# Patient Record
Sex: Female | Born: 1965 | Race: White | Hispanic: No | Marital: Married | Smoking: Never smoker
Health system: Southern US, Community
[De-identification: ages and names within clinical notes are randomized; demographics above are authoritative.]

## PROBLEM LIST (undated history)

## (undated) DIAGNOSIS — E119 Type 2 diabetes mellitus without complications: Secondary | ICD-10-CM

## (undated) DIAGNOSIS — O1490 Unspecified pre-eclampsia, unspecified trimester: Secondary | ICD-10-CM

## (undated) DIAGNOSIS — I10 Essential (primary) hypertension: Secondary | ICD-10-CM

## (undated) DIAGNOSIS — T7840XA Allergy, unspecified, initial encounter: Secondary | ICD-10-CM

## (undated) DIAGNOSIS — G4700 Insomnia, unspecified: Secondary | ICD-10-CM

## (undated) DIAGNOSIS — F32A Depression, unspecified: Secondary | ICD-10-CM

## (undated) DIAGNOSIS — R87629 Unspecified abnormal cytological findings in specimens from vagina: Secondary | ICD-10-CM

## (undated) DIAGNOSIS — E785 Hyperlipidemia, unspecified: Secondary | ICD-10-CM

## (undated) DIAGNOSIS — R7301 Impaired fasting glucose: Secondary | ICD-10-CM

## (undated) DIAGNOSIS — G43909 Migraine, unspecified, not intractable, without status migrainosus: Secondary | ICD-10-CM

## (undated) DIAGNOSIS — F329 Major depressive disorder, single episode, unspecified: Secondary | ICD-10-CM

## (undated) HISTORY — DX: Unspecified pre-eclampsia, unspecified trimester: O14.90

## (undated) HISTORY — DX: Allergy, unspecified, initial encounter: T78.40XA

## (undated) HISTORY — DX: Unspecified abnormal cytological findings in specimens from vagina: R87.629

## (undated) HISTORY — DX: Impaired fasting glucose: R73.01

## (undated) HISTORY — DX: Insomnia, unspecified: G47.00

## (undated) HISTORY — PX: OTHER SURGICAL HISTORY: SHX169

## (undated) HISTORY — DX: Essential (primary) hypertension: I10

## (undated) HISTORY — DX: Major depressive disorder, single episode, unspecified: F32.9

## (undated) HISTORY — DX: Hyperlipidemia, unspecified: E78.5

## (undated) HISTORY — DX: Depression, unspecified: F32.A

## (undated) HISTORY — DX: Migraine, unspecified, not intractable, without status migrainosus: G43.909

---

## 1995-07-20 DIAGNOSIS — I10 Essential (primary) hypertension: Secondary | ICD-10-CM

## 1995-07-20 HISTORY — DX: Essential (primary) hypertension: I10

## 1998-10-06 ENCOUNTER — Other Ambulatory Visit: Admission: RE | Admit: 1998-10-06 | Discharge: 1998-10-06 | Payer: Self-pay | Admitting: Gynecology

## 1999-10-13 ENCOUNTER — Other Ambulatory Visit: Admission: RE | Admit: 1999-10-13 | Discharge: 1999-10-13 | Payer: Self-pay | Admitting: Gynecology

## 2000-11-22 ENCOUNTER — Other Ambulatory Visit: Admission: RE | Admit: 2000-11-22 | Discharge: 2000-11-22 | Payer: Self-pay | Admitting: Gynecology

## 2002-01-12 ENCOUNTER — Other Ambulatory Visit: Admission: RE | Admit: 2002-01-12 | Discharge: 2002-01-12 | Payer: Self-pay | Admitting: Gynecology

## 2002-05-24 ENCOUNTER — Other Ambulatory Visit: Admission: RE | Admit: 2002-05-24 | Discharge: 2002-05-24 | Payer: Self-pay | Admitting: Gynecology

## 2002-07-04 ENCOUNTER — Encounter: Payer: Self-pay | Admitting: Internal Medicine

## 2002-07-04 ENCOUNTER — Encounter: Admission: RE | Admit: 2002-07-04 | Discharge: 2002-07-04 | Payer: Self-pay | Admitting: Internal Medicine

## 2002-07-13 ENCOUNTER — Encounter: Admission: RE | Admit: 2002-07-13 | Discharge: 2002-08-07 | Payer: Self-pay | Admitting: Internal Medicine

## 2002-09-21 ENCOUNTER — Other Ambulatory Visit: Admission: RE | Admit: 2002-09-21 | Discharge: 2002-09-21 | Payer: Self-pay | Admitting: Gynecology

## 2003-01-18 ENCOUNTER — Other Ambulatory Visit: Admission: RE | Admit: 2003-01-18 | Discharge: 2003-01-18 | Payer: Self-pay | Admitting: Gynecology

## 2003-06-25 ENCOUNTER — Other Ambulatory Visit: Admission: RE | Admit: 2003-06-25 | Discharge: 2003-06-25 | Payer: Self-pay | Admitting: Gynecology

## 2003-06-25 ENCOUNTER — Encounter: Admission: RE | Admit: 2003-06-25 | Discharge: 2003-06-25 | Payer: Self-pay | Admitting: Gynecology

## 2004-04-20 ENCOUNTER — Other Ambulatory Visit: Admission: RE | Admit: 2004-04-20 | Discharge: 2004-04-20 | Payer: Self-pay | Admitting: Gynecology

## 2007-04-12 ENCOUNTER — Ambulatory Visit: Payer: Self-pay | Admitting: Family Medicine

## 2007-04-12 DIAGNOSIS — G43009 Migraine without aura, not intractable, without status migrainosus: Secondary | ICD-10-CM | POA: Insufficient documentation

## 2007-04-12 DIAGNOSIS — G4700 Insomnia, unspecified: Secondary | ICD-10-CM | POA: Insufficient documentation

## 2007-04-12 DIAGNOSIS — I10 Essential (primary) hypertension: Secondary | ICD-10-CM | POA: Insufficient documentation

## 2007-04-19 ENCOUNTER — Encounter: Payer: Self-pay | Admitting: Family Medicine

## 2007-04-19 LAB — CONVERTED CEMR LAB
ALT: 19 units/L (ref 0–35)
AST: 16 units/L (ref 0–37)
Albumin: 4.2 g/dL (ref 3.5–5.2)
Alkaline Phosphatase: 87 units/L (ref 39–117)
BUN: 13 mg/dL (ref 6–23)
CO2: 22 meq/L (ref 19–32)
Calcium: 8.8 mg/dL (ref 8.4–10.5)
Chloride: 102 meq/L (ref 96–112)
Cholesterol: 170 mg/dL (ref 0–200)
Creatinine, Ser: 0.76 mg/dL (ref 0.40–1.20)
Glucose, Bld: 110 mg/dL — ABNORMAL HIGH (ref 70–99)
HDL: 71 mg/dL (ref >39)
LDL Cholesterol: 85 mg/dL (ref 0–99)
Potassium: 4 meq/L (ref 3.5–5.3)
Sodium: 137 meq/L (ref 135–145)
Total Bilirubin: 0.4 mg/dL (ref 0.3–1.2)
Total CHOL/HDL Ratio: 2.4
Total Protein: 7.1 g/dL (ref 6.0–8.3)
Triglycerides: 72 mg/dL (ref <150)
VLDL: 14 mg/dL (ref 0–40)

## 2007-04-20 ENCOUNTER — Encounter: Payer: Self-pay | Admitting: Family Medicine

## 2007-05-03 ENCOUNTER — Ambulatory Visit: Payer: Self-pay | Admitting: Family Medicine

## 2007-05-03 DIAGNOSIS — R7301 Impaired fasting glucose: Secondary | ICD-10-CM | POA: Insufficient documentation

## 2007-05-03 LAB — CONVERTED CEMR LAB: Blood Glucose, Fasting: 113 mg/dL

## 2007-07-03 ENCOUNTER — Ambulatory Visit: Payer: Self-pay | Admitting: Family Medicine

## 2007-07-03 DIAGNOSIS — F3289 Other specified depressive episodes: Secondary | ICD-10-CM | POA: Insufficient documentation

## 2007-07-03 DIAGNOSIS — F329 Major depressive disorder, single episode, unspecified: Secondary | ICD-10-CM

## 2007-08-03 ENCOUNTER — Ambulatory Visit: Payer: Self-pay | Admitting: Family Medicine

## 2007-08-14 ENCOUNTER — Telehealth (INDEPENDENT_AMBULATORY_CARE_PROVIDER_SITE_OTHER): Payer: Self-pay | Admitting: *Deleted

## 2007-11-01 ENCOUNTER — Ambulatory Visit: Payer: Self-pay | Admitting: Family Medicine

## 2007-11-01 LAB — CONVERTED CEMR LAB: Blood Glucose, Fasting: 100 mg/dL

## 2008-04-19 ENCOUNTER — Ambulatory Visit: Payer: Self-pay | Admitting: Family Medicine

## 2008-04-19 LAB — CONVERTED CEMR LAB
ALT: 20 units/L (ref 0–35)
AST: 18 units/L (ref 0–37)
Albumin: 4.4 g/dL (ref 3.5–5.2)
Alkaline Phosphatase: 75 units/L (ref 39–117)
BUN: 12 mg/dL (ref 6–23)
Blood Glucose, Fasting: 108 mg/dL
CO2: 19 meq/L (ref 19–32)
Calcium: 9.4 mg/dL (ref 8.4–10.5)
Chloride: 103 meq/L (ref 96–112)
Cholesterol: 190 mg/dL (ref 0–200)
Creatinine, Ser: 0.79 mg/dL (ref 0.40–1.20)
Glucose, Bld: 96 mg/dL (ref 70–99)
HDL: 64 mg/dL (ref >39)
LDL Cholesterol: 103 mg/dL — ABNORMAL HIGH (ref 0–99)
Potassium: 4.6 meq/L (ref 3.5–5.3)
Sodium: 139 meq/L (ref 135–145)
Total Bilirubin: 0.5 mg/dL (ref 0.3–1.2)
Total CHOL/HDL Ratio: 3
Total Protein: 7.2 g/dL (ref 6.0–8.3)
Triglycerides: 113 mg/dL (ref <150)
VLDL: 23 mg/dL (ref 0–40)

## 2008-04-22 ENCOUNTER — Encounter: Payer: Self-pay | Admitting: Family Medicine

## 2008-05-06 ENCOUNTER — Ambulatory Visit: Payer: Self-pay | Admitting: Occupational Medicine

## 2008-05-06 DIAGNOSIS — J1089 Influenza due to other identified influenza virus with other manifestations: Secondary | ICD-10-CM | POA: Insufficient documentation

## 2008-09-23 ENCOUNTER — Ambulatory Visit: Payer: Self-pay | Admitting: Family

## 2008-09-23 ENCOUNTER — Encounter: Payer: Self-pay | Admitting: Family

## 2008-09-30 ENCOUNTER — Encounter: Admission: RE | Admit: 2008-09-30 | Discharge: 2008-09-30 | Payer: Self-pay | Admitting: Obstetrics & Gynecology

## 2008-09-30 LAB — HM MAMMOGRAPHY: HM Mammogram: NEGATIVE

## 2009-03-21 ENCOUNTER — Ambulatory Visit: Payer: Self-pay | Admitting: Family Medicine

## 2009-03-21 LAB — CONVERTED CEMR LAB: Glucose, 2 hr postprandial: 120 mg/dL

## 2009-10-10 ENCOUNTER — Encounter: Payer: Self-pay | Admitting: Family Medicine

## 2009-10-13 LAB — CONVERTED CEMR LAB
ALT: 12 units/L (ref 0–35)
CO2: 22 meq/L (ref 19–32)
Calcium: 9.7 mg/dL (ref 8.4–10.5)
Chloride: 102 meq/L (ref 96–112)
Cholesterol: 179 mg/dL (ref 0–200)
Glucose, 2 hour: 98 mg/dL (ref 70–139)
Glucose, Fasting: 101 mg/dL — ABNORMAL HIGH (ref 70–99)
Sodium: 137 meq/L (ref 135–145)
Total Protein: 7.3 g/dL (ref 6.0–8.3)
VLDL: 20 mg/dL (ref 0–40)

## 2010-05-29 ENCOUNTER — Ambulatory Visit: Payer: Self-pay | Admitting: Family Medicine

## 2010-05-29 DIAGNOSIS — E663 Overweight: Secondary | ICD-10-CM | POA: Insufficient documentation

## 2010-08-20 NOTE — Assessment & Plan Note (Signed)
Summary: f/u IFG/ HTN   Vital Signs:  Patient profile:   45 year old female Height:      64.25 inches Weight:      177 pounds BMI:     30.25 O2 Sat:      99 % on Room air Pulse rate:   90 / minute BP sitting:   154 / 101  (left arm) Cuff size:   regular  Vitals Entered By: Payton Spark CMA (May 29, 2010 8:20 AM)  O2 Flow:  Room air CC: F/U HTN   Primary Care Provider:  Seymour Bars DO  CC:  F/U HTN.  History of Present Illness: 45 yo WF presents for f/u HTN and IFG.  She ran out of Lisinopril/HCTZ and her metformin.  AM fastings are in the 90s to low 100s.  She has been watching her diet and exercising just not very regularly.  She has gained wt.  Denies CP, SOB, leg swelling.  She is still taking Metoprolol.  IFG was confirmed with a 2 hr OGTT in May this year.    Current Medications (verified): 1)  Metoprolol Tartrate 100 Mg  Tabs (Metoprolol Tartrate) .... Take 1 Tablet By Mouth Two Times A Day 2)  Multivitamins   Tabs (Multiple Vitamin) .... .qdtab 3)  Alavert 10 Mg  Tabs (Loratadine) .... Take 1 Tablet By Mouth Once A Day As Needed 4)  Metformin Hcl 500 Mg  Tabs (Metformin Hcl) .Marland Kitchen.. 1 Tab By Mouth Two Times A Day 5)  Glucometer With Test Strips .... Use As Directed 6)  1st Choice Lancets Thin   Misc (Lancets) .... Use As Directed 7)  One Touch Ultra Testing Strips .... Use As Directed Dx:271.3 Testing Three Times Per Week 8)  Grape Seed 50 Mg Caps (Grape Seed) 9)  Glucosamine 500 Mg Caps (Glucosamine Sulfate) 10)  Fish Oil 1000 Mg Caps (Omega-3 Fatty Acids)  Allergies (verified): 1)  ! Codeine Sulfate (Codeine Sulfate)  Past History:  Past Medical History: Reviewed history from 11/01/2007 and no changes required. migraines HTN, 1997 seasonal allergies insomnia G2P1101- Preeclampsia and GDM depression impaired fasting glucose  gyn down the hall - calling for appt  Social History: Reviewed history from 04/12/2007 and no changes required. 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  Has boyfriend.  Sexually active.  53 yo daughter.   Never smoked. 1-2 ETOh/ wk Does not exercise. Gradual weight gain, fair diet.    Review of Systems      See HPI  Physical Exam  General:  alert, well-developed, well-nourished, well-hydrated, and overweight-appearing.   Head:  normocephalic and atraumatic.   Eyes:  conjunctiva clear but slightly watery Ears:  EACs patent; TMs translucent and gray with good cone of light and bony landmarks.  Nose:  scant clear rhinorrhea Mouth:  good dentition and pharynx pink and moist.   Neck:  no masses.   Lungs:  Normal respiratory effort, chest expands symmetrically. Lungs are clear to auscultation, no crackles or wheezes. Heart:  Normal rate and regular rhythm. S1 and S2 normal without gallop, murmur, click, rub or other extra sounds. Skin:  color normal.   Cervical Nodes:  No lymphadenopathy noted Psych:  good eye contact, not anxious appearing, and not depressed appearing.     Impression & Recommendations:  Problem # 1:  HYPERTENSION, BENIGN ESSENTIAL (ICD-401.1) Assessment Deteriorated She is to be on these 2 anti- hypertensives.  I cleared this up for her today and made sure she had adequate RFs.  Labs are UTD.   The following medications were removed from the medication list:    Lisinopril-hydrochlorothiazide 20-12.5 Mg Tabs (Lisinopril-hydrochlorothiazide) .Marland Kitchen... 1 tab by mouth daily Her updated medication list for this problem includes:    Metoprolol Tartrate 100 Mg Tabs (Metoprolol tartrate) .Marland Kitchen... Take 1 tablet by mouth two times a day    Lisinopril-hydrochlorothiazide 20-12.5 Mg Tabs (Lisinopril-hydrochlorothiazide) .Marland Kitchen... 1 tab by mouth daily  BP today: 154/101 Prior BP: 134/89 (03/21/2009)  Labs Reviewed: K+: 4.4 (10/10/2009) Creat: : 0.85 (10/10/2009)   Chol: 179 (10/10/2009)   HDL: 61 (10/10/2009)   LDL: 98 (10/10/2009)   TG: 100 (10/10/2009)  Problem # 2:  IMPAIRED FASTING GLUCOSE  (ICD-790.21) A1C 6 c/w IFG.  She is to stay on Metformin but we talked about doing a modified Adkins low carb diet  ~30 CHO/ day + regular exercise to help with insulin resistance and wt loss.  RTC in 3 mos to recheck WT.  I may have her start checking her sugars at home. Her updated medication list for this problem includes:    Metformin Hcl 500 Mg Tabs (Metformin hcl) .Marland Kitchen... 1 tab by mouth two times a day  Orders: Fingerstick (98119) Hemoglobin A1C (14782)  Labs Reviewed: Creat: 0.85 (10/10/2009)     Problem # 3:  OBESITY, CLASS I (ICD-278.02) Assessment: New BMI is now > 30 c/w obesity with more wt gain. Counseled x 10 min face to face on our goals for wt loss via a low carb/ low sugar diet along with exercise goals. Encouraged a food diary.  Complete Medication List: 1)  Metoprolol Tartrate 100 Mg Tabs (Metoprolol tartrate) .... Take 1 tablet by mouth two times a day 2)  Multivitamins Tabs (Multiple vitamin) .... .qdtab 3)  Cetirizine Hcl 10 Mg Tabs (Cetirizine hcl) .Marland Kitchen.. 1 tab by mouth qpm 4)  Metformin Hcl 500 Mg Tabs (Metformin hcl) .Marland Kitchen.. 1 tab by mouth two times a day 5)  1st Choice Lancets Thin Misc (Lancets) .... Use as directed 6)  One Touch Ultra Testing Strips  .... Use as directed dx:271.3  daily 7)  Fish Oil 1000 Mg Caps (Omega-3 fatty acids) 8)  Lisinopril-hydrochlorothiazide 20-12.5 Mg Tabs (Lisinopril-hydrochlorothiazide) .Marland Kitchen.. 1 tab by mouth daily  Patient Instructions: 1)  Restart Lisinopril-HCTZ and take wtih Metoprolol for high BP. 2)  Stay on Metformin 2 x a day. 3)  stick to a LOW CARB/ Low sugar diet + 60 min of exercise 4-5 x a wk. 4)  A1C 6.0= pre - diabetes. 5)  Return to recheck weight/ sugars/ BP in 3 mos. Prescriptions: METFORMIN HCL 500 MG  TABS (METFORMIN HCL) 1 tab by mouth two times a day  #60 x 6   Entered and Authorized by:   Seymour Bars DO   Signed by:   Seymour Bars DO on 05/29/2010   Method used:   Electronically to        [facility]*  (retail)       [address] [place], [place]  [address]       Ph: [phone]       Fax: [phone]   RxID:   [id number] LISINOPRIL-HYDROCHLOROTHIAZIDE 20-12.5 MG TABS (LISINOPRIL-HYDROCHLOROTHIAZIDE) 1 tab by mouth daily  #30 x 6   Entered and Authorized by:   Seymour Bars DO   Signed by:   Seymour Bars DO on 05/29/2010   Method used:   Electronically to        [facility]  [address]* (retail)       [address] [place], [place]  [address]       Ph: [phone]       Fax: [phone]   RxID:   [id number] ONE TOUCH ULTRA TESTING STRIPS USE AS DIRECTED DX:271.3  daily  #1 box x 12   Entered and Authorized by:   Seymour Bars DO   Signed by:   Seymour Bars DO on 05/29/2010   Method used:   Printed then faxed to ...       [facility]* (retail)       [address] [place], [place]  [address]       Ph: [phone]       Fax: [phone]   RxID:   [id number]    Orders Added: 1)  Fingerstick [36416] 2)  Hemoglobin A1C [83036] 3)  Est. Patient Level IV [88416]    Laboratory Results   Blood Tests     HGBA1C: 6.0%   (Normal Range: Non-Diabetic - 3-6%   Control Diabetic - 6-8%)

## 2010-08-31 ENCOUNTER — Ambulatory Visit (INDEPENDENT_AMBULATORY_CARE_PROVIDER_SITE_OTHER): Payer: 59 | Admitting: Family Medicine

## 2010-08-31 ENCOUNTER — Encounter: Payer: Self-pay | Admitting: Family Medicine

## 2010-08-31 DIAGNOSIS — R7301 Impaired fasting glucose: Secondary | ICD-10-CM

## 2010-08-31 DIAGNOSIS — I10 Essential (primary) hypertension: Secondary | ICD-10-CM

## 2010-09-09 NOTE — Assessment & Plan Note (Signed)
Summary: f/u HTN/ IFG   Vital Signs:  Patient profile:   45 year old female Height:      64.25 inches Weight:      163 pounds BMI:     27.86 O2 Sat:      100 % on Room air Pulse rate:   75 / minute BP sitting:   148 / 82  (left arm) Cuff size:   large  Vitals Entered By: Payton Spark CMA (August 31, 2010 3:52 PM)  O2 Flow:  Room air CC: ? sinusitis. F/U weight, BP and sugar   Primary Care Provider:  Seymour Bars DO  CC:  ? sinusitis. F/U weight and BP and sugar.  History of Present Illness: 45 yo WF presents for head congestion that started Friday morning.  She has runny nose, scratchy throat but no fevers or chills.  She has lost 14 lbs since her last visit after changing her diet - cutting back on her carbs and sugar, eating more veggies and lean meats.  She is getting some exericse and is feeling better w/ her wt loss.  She is taking both of her BP meds.  Denies adverse SEs, use of decongestants, chest pain, palpitations, SOB or edema.  She is on Metformin but does not check sugars at home.  Her A1C last visit was 6.       Current Medications (verified): 1)  Metoprolol Tartrate 100 Mg  Tabs (Metoprolol Tartrate) .... Take 1 Tablet By Mouth Two Times A Day 2)  Multivitamins   Tabs (Multiple Vitamin) .... .qdtab 3)  Cetirizine Hcl 10 Mg Tabs (Cetirizine Hcl) .Marland Kitchen.. 1 Tab By Mouth Qpm 4)  Metformin Hcl 500 Mg  Tabs (Metformin Hcl) .Marland Kitchen.. 1 Tab By Mouth Two Times A Day 5)  1st Choice Lancets Thin   Misc (Lancets) .... Use As Directed 6)  One Touch Ultra Testing Strips .... Use As Directed Dx:271.3  Daily 7)  Fish Oil 1000 Mg Caps (Omega-3 Fatty Acids) 8)  Lisinopril-Hydrochlorothiazide 20-12.5 Mg Tabs (Lisinopril-Hydrochlorothiazide) .Marland Kitchen.. 1 Tab By Mouth Daily 9)  Glucosamine 500 Mg Caps (Glucosamine Sulfate)  Allergies (verified): 1)  ! Codeine Sulfate (Codeine Sulfate)  Past History:  Past Medical History: Reviewed history from 11/01/2007 and no changes  required. migraines HTN, 1997 seasonal allergies insomnia G2P1101- Preeclampsia and GDM depression impaired fasting glucose  gyn down the hall - calling for appt  Social History: Reviewed history from 04/12/2007 and no changes required. 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  Sexually active.  64 yo daughter.   Never smoked. 1-2 ETOh/ wk Does not exercise. Gradual weight gain, fair diet.    Review of Systems      See HPI  Physical Exam  General:  alert, well-developed, well-nourished, and well-hydrated.   Head:  normocephalic and atraumatic.   Eyes:  pupils equal, pupils round, and pupils reactive to light.   Nose:  nasal congestion present Mouth:  pharynx pink and moist.   Neck:  no masses.   Lungs:  Normal respiratory effort, chest expands symmetrically. Lungs are clear to auscultation, no crackles or wheezes. Heart:  Normal rate and regular rhythm. S1 and S2 normal without gallop, murmur, click, rub or other extra sounds. Extremities:  no LE edema Skin:  color normal.   Psych:  good eye contact, not anxious appearing, and not depressed appearing.     Impression & Recommendations:  Problem # 1:  HYPERTENSION, BENIGN ESSENTIAL (ICD-401.1) Improved but still not at goal.  Changed Lisinopril / HCTZ 20/12.5 mg to Lisinopril 40 mg/ day + HCTZ 12.5 mg/ day.  Continue Metoprolol.  Labs UTD. F/U in 3 mos.   Her updated medication list for this problem includes:    Metoprolol Tartrate 100 Mg Tabs (Metoprolol tartrate) .Marland Kitchen... Take 1 tablet by mouth two times a day    Lisinopril 40 Mg Tabs (Lisinopril) .Marland Kitchen... 1 tab by mouth once daily    Hydrochlorothiazide 12.5 Mg Tabs (Hydrochlorothiazide) .Marland Kitchen... 1 tab by mouth once daily  BP today: 148/82 Prior BP: 154/101 (05/29/2010)  Labs Reviewed: K+: 4.4 (10/10/2009) Creat: : 0.85 (10/10/2009)   Chol: 179 (10/10/2009)   HDL: 61 (10/10/2009)   LDL: 98 (10/10/2009)   TG: 100 (10/10/2009)  Problem # 2:  IMPAIRED  FASTING GLUCOSE (ICD-790.21) A1C 3 mos ago 6.0 c/w IFG and is doing well on Metformin given her other risk factors.  Does not need to check home sugars.  She has done great with diet, exercise and wt loss, down 14 lbs in 3 mos.  Keep up the good work and recheck labs in 3 mos. Her updated medication list for this problem includes:    Metformin Hcl 500 Mg Tabs (Metformin hcl) .Marland Kitchen... 1 tab by mouth two times a day  Complete Medication List: 1)  Metoprolol Tartrate 100 Mg Tabs (Metoprolol tartrate) .... Take 1 tablet by mouth two times a day 2)  Cetirizine Hcl 10 Mg Tabs (Cetirizine hcl) .Marland Kitchen.. 1 tab by mouth qpm 3)  Metformin Hcl 500 Mg Tabs (Metformin hcl) .Marland Kitchen.. 1 tab by mouth two times a day 4)  Fish Oil 1000 Mg Caps (Omega-3 fatty acids) 5)  Lisinopril 40 Mg Tabs (Lisinopril) .Marland Kitchen.. 1 tab by mouth once daily 6)  Hydrochlorothiazide 12.5 Mg Tabs (Hydrochlorothiazide) .Marland Kitchen.. 1 tab by mouth once daily  Patient Instructions: 1)  Change Lisinopril / HCTZ 20/12.5 mg to: 2)  Lisinopril 40 mg once a day and HCTZ 12.5 mg once a day. 3)  Stay on Metoporolol. 4)  Keep up the good work with low sugar/ low carb diet with a goal of 1 hr of exercise 4 days/ wk. 5)  Return for f/u BP/ Sugar/ Fasting labs in 3 mos. Prescriptions: HYDROCHLOROTHIAZIDE 12.5 MG TABS (HYDROCHLOROTHIAZIDE) 1 tab by mouth once daily  #90 x 1   Entered and Authorized by:   Seymour Bars DO   Signed by:   Seymour Bars DO on 08/31/2010   Method used:   Electronically to        [facility]* (retail)       [address] [place], [place]  [address]       Ph: [phone]       Fax: [phone]   RxID:   [id number] LISINOPRIL 40 MG TABS (LISINOPRIL) 1 tab by mouth once daily  #90 x 1   Entered and Authorized by:   Seymour Bars DO   Signed by:   Seymour Bars DO on 08/31/2010   Method used:   Electronically to        [facility]* (retail)       [address] [place], [place]  [address]       Ph: [phone]        Fax: [phone]   RxID:   [id number]    Orders Added: 1)  Est. Patient Level III [88416]

## 2010-11-25 ENCOUNTER — Other Ambulatory Visit: Payer: Self-pay | Admitting: Family Medicine

## 2010-11-27 ENCOUNTER — Encounter: Payer: Self-pay | Admitting: Family Medicine

## 2010-11-30 ENCOUNTER — Other Ambulatory Visit: Payer: Self-pay | Admitting: *Deleted

## 2010-11-30 ENCOUNTER — Encounter: Payer: Self-pay | Admitting: Family Medicine

## 2010-11-30 MED ORDER — METFORMIN HCL 500 MG PO TABS: 500.0000 mg | ORAL_TABLET | Freq: Two times a day (BID) | ORAL | Status: DC

## 2010-12-01 ENCOUNTER — Encounter: Payer: 59 | Admitting: Family Medicine

## 2010-12-01 NOTE — Assessment & Plan Note (Signed)
Vanessa Valdez, Vanessa Valdez                  ACCOUNT NO.:  [id number]   MEDICAL RECORD NO.:  [id number]           PATIENT TYPE:   LOCATION:  [facility]           FACILITY:   PHYSICIAN:  Sid Falcon, CNM  DATE OF BIRTH:  [birth year]   DATE OF SERVICE:                                  CLINIC NOTE   The patient is here for well-woman exam.   Medical history includes hypertension x12 years and empiric glucose  tolerance x2 years.   CURRENT MEDICATIONS:  Metoprolol, lisinopril, Nexium, and metformin.   MENSTRUAL HISTORY:  LMP was September 12, 2008.  Menarche began at 45  years of age, regular cycles every 20-30 days, lasting approximately 4-5  days with a light flow.  The patient denies any bleeding in between  periods.   OBSTETRICAL HISTORY:  Two pregnancies, normal spontaneous vaginal  delivery 12 years ago, and one SAB.   CONTRACEPTIVE HISTORY:  Partner had a vasectomy.   GYN HISTORY:  Last Pap smear 2 years ago.  The patient reports having  abnormal Pap smear 6 years ago with colpo procedure completed and with  repeat Pap q.6 months x2 years all within normal limits.   SURGICAL HISTORY:  None.   FAMILY HISTORY:  Diabetes father.  Grandparents heart disease.  Grandparents heart attack, grandfather age 12.  High blood pressure in  mother and grandparents.  Cancer:  Grandmother.   SOCIAL HISTORY:  Lives with daughter and partner.  The patient denies  smoking, occasionally drinks alcohol.  No history of the sexual partner  using IV drugs.  No history of physical or sexual abuse.   REVIEW OF SYSTEMS:  The patient reports headaches approximately 1 week  prior to menstrual cycle each month, also reports decreased libido and  concern if it is related to hormonal issues or medication use.   OBJECTIVE:  VITAL SIGNS:  The patient's vital signs stable.  Blood  pressure 140/94, weight 168, height 5 feet 3 inches, pulse of 104.  GENERAL:  Alert and oriented x3.  NECK:  No  thyromegaly.  CHEST:  Cardiovascular system, regular rate and rhythm without murmurs,  gallops, or rubs.  LUNGS:  Clear to auscultation bilaterally.  BREASTS:  Soft, nontender.  No nipple discharge.  No dominant masses.  No retractions.  ABDOMEN:  No splenomegaly.  PELVIC:  Vagina, no redness, no abnormal discharge, no abnormal lesions.  Cervix, patent os, no abnormal discharge, negative cervical motion  tenderness.  Uterus mobile, midline, no dominant masses, nontender with  palpation.  Adnexa, nontender with palpation, no abnormal masses.  EXTREMITIES:  Trace bilateral pedal edema, 2+ bilateral patellar DTRs.   ASSESSMENT:  1. Well-woman exam.  2. Hypertension.  3. Impaired glucose tolerance.  4. Decreased libido.   PLAN:  Labs, TSH, free T3, free T4, total testosterone, and Pap smear  sent to lab.  The patient declines Chlamydia and gonorrhea.  Advised on  mammogram.  The patient will schedule this year.  Reviewed hypertension  and diabetes and impact on health and medications, potential  implications on the blood flow.  The patient will continue to receive  followup with Dr. Talmage Nap for a primary care  and will follow up in 1 year  for a well-woman exam or sooner if needed.      Sid Falcon, CNM     WM/MEDQ  D:  09/23/2008  T:  09/24/2008  Job:  [id number]

## 2011-03-03 ENCOUNTER — Other Ambulatory Visit: Payer: Self-pay | Admitting: *Deleted

## 2011-03-03 MED ORDER — HYDROCHLOROTHIAZIDE 12.5 MG PO TABS: 12.5000 mg | ORAL_TABLET | Freq: Every day | ORAL | Status: DC

## 2011-03-31 ENCOUNTER — Other Ambulatory Visit: Payer: Self-pay | Admitting: Family Medicine

## 2011-04-01 NOTE — Telephone Encounter (Signed)
PT NEEDS OFFICE VISIT FOLLOW UP BP BEFORE THIS 30 DAY REFILL RUNS OUT!

## 2011-05-03 ENCOUNTER — Other Ambulatory Visit: Payer: Self-pay | Admitting: Family Medicine

## 2011-05-13 ENCOUNTER — Encounter: Payer: Self-pay | Admitting: Family Medicine

## 2011-05-13 ENCOUNTER — Ambulatory Visit (INDEPENDENT_AMBULATORY_CARE_PROVIDER_SITE_OTHER): Payer: 59 | Admitting: Family Medicine

## 2011-05-13 VITALS — BP 138/90 | HR 92 | Temp 99.0°F | Wt 150.0 lb

## 2011-05-13 DIAGNOSIS — I10 Essential (primary) hypertension: Secondary | ICD-10-CM

## 2011-05-13 DIAGNOSIS — Z23 Encounter for immunization: Secondary | ICD-10-CM

## 2011-05-13 DIAGNOSIS — R7301 Impaired fasting glucose: Secondary | ICD-10-CM

## 2011-05-13 LAB — POCT GLYCOSYLATED HEMOGLOBIN (HGB A1C): Hemoglobin A1C: 5.7

## 2011-05-13 MED ORDER — METFORMIN HCL 500 MG PO TABS: 500.0000 mg | ORAL_TABLET | Freq: Every day | ORAL | Status: DC

## 2011-05-13 MED ORDER — LISINOPRIL 40 MG PO TABS: 40.0000 mg | ORAL_TABLET | Freq: Every day | ORAL | Status: DC

## 2011-05-13 NOTE — Patient Instructions (Signed)
Follow up in 4-6 months for your A1C since we are cutting your medication in half Follow up in one month to recheck your BP, can do a nurse visit if would like.

## 2011-05-13 NOTE — Progress Notes (Signed)
  Subjective:    Patient ID: Vanessa Valdez, female    DOB: 30-Aug-1965, 45 y.o.   MRN: [id number]  HPI HTN - She has concerns over the HCTZ. She read on the label that it can cause increased blood glucose. She is not actually taking her lisinopril.  She is not sure why.  She is taking her metoprolol. She's not having any side effects. No chest pain or shortness of breath. She is very compliant with her medications.  IFG- she takes her metformin twice a day. She's been trying to make some major dietary changes and has lost almost 30 pounds in the last year. Per our scales has lost about 27.  Review of Systems     Objective:   Physical Exam  Constitutional: She is oriented to person, place, and time. She appears well-developed and well-nourished.  HENT:  Head: Normocephalic and atraumatic.  Cardiovascular: Normal rate, regular rhythm and normal heart sounds.   Pulmonary/Chest: Effort normal and breath sounds normal.  Neurological: She is oriented to person, place, and time.  Psychiatric: She has a normal mood and affect. Her behavior is normal.          Assessment & Plan:  HTN - Not well controlled. D/C hctz since she has concerns. Will retart the lisinopril. New rx sent. Recheck BP in 6 weeks to make sure she is tolerating the medication well and her blood pressures under better control..   IFG - Recheck A1C is improved form 6.0.  We discussed a couple different options. We can either stop her metformin completely her decrease her down to once a day and then recheck her A1c in 4-6 months. She would like to decrease her dose first before completely stopping it. I congratulated her on her significant weight loss over the last year. She has done a fantastic job. If she stable and doing really well then we can discontinue the metformin completely. Lab Results  Component Value Date   HGBA1C 5.7 05/13/2011

## 2011-06-24 ENCOUNTER — Ambulatory Visit (INDEPENDENT_AMBULATORY_CARE_PROVIDER_SITE_OTHER): Payer: 59 | Admitting: Family Medicine

## 2011-06-24 VITALS — BP 124/75 | HR 67

## 2011-06-24 DIAGNOSIS — I10 Essential (primary) hypertension: Secondary | ICD-10-CM

## 2011-06-24 NOTE — Progress Notes (Signed)
  Subjective:    Patient ID: Vanessa Valdez, female    DOB: September 18, 1965, 45 y.o.   MRN: [id number] BP check. No complaints of SOB, chest pain or dizziness. New med seems to be working HPI    Review of Systems     Objective:   Physical Exam        Assessment & Plan:  Hypertension-her blood pressure is well controlled today. Continue current regimen. The case and refills if needed. Followup in 4 months. Have patient go for BMP to check kidney function and potassium level. Order placed.

## 2011-07-05 ENCOUNTER — Other Ambulatory Visit: Payer: Self-pay | Admitting: *Deleted

## 2011-07-05 MED ORDER — METOPROLOL TARTRATE 100 MG PO TABS: 100.0000 mg | ORAL_TABLET | Freq: Two times a day (BID) | ORAL | Status: DC

## 2011-08-18 ENCOUNTER — Emergency Department
Admit: 2011-08-18 | Discharge: 2011-08-18 | Disposition: A | Discharge status: Home / Self Care | Payer: 59 | Attending: Emergency Medicine | Admitting: Emergency Medicine

## 2011-08-18 ENCOUNTER — Encounter: Payer: Self-pay | Admitting: *Deleted

## 2011-08-18 ENCOUNTER — Emergency Department (INDEPENDENT_AMBULATORY_CARE_PROVIDER_SITE_OTHER)
Admission: EM | Admit: 2011-08-18 | Discharge: 2011-08-18 | Disposition: A | Discharge status: Home / Self Care | Payer: 59 | Source: Home / Self Care | Attending: Emergency Medicine | Admitting: Emergency Medicine

## 2011-08-18 DIAGNOSIS — M7918 Myalgia, other site: Secondary | ICD-10-CM

## 2011-08-18 DIAGNOSIS — M25531 Pain in right wrist: Secondary | ICD-10-CM

## 2011-08-18 DIAGNOSIS — IMO0001 Reserved for inherently not codable concepts without codable children: Secondary | ICD-10-CM

## 2011-08-18 DIAGNOSIS — M25539 Pain in unspecified wrist: Secondary | ICD-10-CM

## 2011-08-18 NOTE — ED Provider Notes (Signed)
History     CSN: [id number]  Arrival date & time 08/18/11  1614   First MD Initiated Contact with Patient 08/18/11 1634      Chief Complaint  Patient presents with  . Wrist Pain  . Back Pain    (Consider location/radiation/quality/duration/timing/severity/associated sxs/prior treatment) HPI This is a right-handed female patient who comes today complaining of right wrist pain. She fell this morning and landed on her wrist and her butt. She does not recall exactly how she caught herself but she knows that she caught herself with her right hand and wrist and it has been hurting ever since. She reports some swelling and bruising on that wrist. No previous injuries or fractures are. She has not been using any medications or modalities. She also states that she has some lower back and buttock pain secondary to landing on her butt while falling. No radiation of pain. No bladder or bowel incontinence, no weakness,  Past Medical History  Diagnosis Date  . Migraines   . Hypertension 1997  . Allergy   . Insomnia   . Preeclampsia   . Depression   . Impaired fasting glucose     History reviewed. No pertinent past surgical history.  Family History  Problem Relation Age of Onset  . Hypertension Mother   . Diabetes Father   . Cancer Paternal Grandmother 42    colon  . Cancer Paternal Grandfather     myleoma    History  Substance Use Topics  . Smoking status: Never Smoker   . Smokeless tobacco: Not on file  . Alcohol Use: Yes    OB History    Grav Para Term Preterm Abortions TAB SAB Ect Mult Living                  Review of Systems  Allergies  Codeine sulfate and Penicillins  Home Medications   Current Outpatient Rx  Name Route Sig Dispense Refill  . CETIRIZINE HCL 10 MG PO TABS Oral Take 10 mg by mouth every evening.      Marland Kitchen OMEGA-3 FATTY ACIDS 1000 MG PO CAPS       . LISINOPRIL 40 MG PO TABS Oral Take 1 tablet (40 mg total) by mouth daily. 90 tablet 1  . METFORMIN  HCL 500 MG PO TABS Oral Take 1 tablet (500 mg total) by mouth daily with breakfast. 90 tablet 1  . METOPROLOL TARTRATE 100 MG PO TABS Oral Take 1 tablet (100 mg total) by mouth 2 (two) times daily. 180 tablet 1    BP 159/99  Pulse 90  Temp(Src) 99.6 F (37.6 C) (Oral)  Resp 16  Ht 5\' 3"  (1.6 m)  Wt 150 lb 8 oz (68.266 kg)  BMI 26.66 kg/m2  SpO2 99%  LMP 08/07/2011  Physical Exam  Nursing note and vitals reviewed. Constitutional: She is oriented to person, place, and time. She appears well-developed and well-nourished.  HENT:  Head: Normocephalic and atraumatic.  Eyes: No scleral icterus.  Neck: Neck supple.  Cardiovascular: Regular rhythm and normal heart sounds.   Pulmonary/Chest: Effort normal and breath sounds normal. No respiratory distress.  Musculoskeletal:       Right wrist examination demonstrates reduced range of motion secondary to pain. There is a small bruise and some swelling on the volar aspect of the wrist around the wrist crease. There is no snuffbox tenderness. There is tenderness on the central volar wrist with the area of swelling and with manipulation of the distal radius.  Distal neurovascular status is intact.  Lower back examination demonstrates no tenderness on the paraspinal were central in bony areas. I cannot elicit any tenderness to palpation no bruising or swelling. Straight leg raise is negative viral. 4 range of motion and normal gait.  Neurological: She is alert and oriented to person, place, and time.  Skin: Skin is warm and dry.  Psychiatric: She has a normal mood and affect. Her speech is normal.    ED Course  Procedures (including critical care time)  Labs Reviewed - No data to display Dg Wrist Complete Right  08/18/2011  *RADIOLOGY REPORT*  Clinical Data: Fall.  Right wrist pain.  Wrist bruising.  RIGHT WRIST - COMPLETE 3+ VIEW  Comparison: None.  Findings: Anatomic alignment.  No fracture.  Scaphoid bone appears intact.  Dedicated scaphoid  view is normal.  IMPRESSION: Negative radiographs of the right wrist.  Original Report Authenticated By: Andreas Newport, M.D.     1. Right wrist pain   2. Buttock pain       MDM   An x-ray is obtained of the right wrist and read by the radiologist as above. Encourage rest, ice, compression with ACE bandage and/or a brace, and elevation of injured body part. I placed her in a right wrist splint.  I gave her the information for sports medicine to followup a week.  For the mild lower back and buttock pain, the patient would like to hold off on an x-ray for now I think that that is fine since I do not see any red flags concerning with further injury. If her pain discontinue, then an x-ray may be appropriate of the coccyx as well as lumbar spine. I advised rest, alternating heat and ice, and avoiding heavy lifting pushing and pulling.    Lily Kocher, MD 08/18/11 1736

## 2011-08-18 NOTE — ED Notes (Signed)
Pt c/o RT wrist injury and low back pain post fall this AM @home . She has taken advil.

## 2011-09-27 ENCOUNTER — Other Ambulatory Visit: Payer: Self-pay | Admitting: Family Medicine

## 2011-11-09 ENCOUNTER — Other Ambulatory Visit: Payer: Self-pay | Admitting: Family Medicine

## 2011-12-30 ENCOUNTER — Other Ambulatory Visit: Payer: Self-pay | Admitting: Family Medicine

## 2012-01-05 ENCOUNTER — Other Ambulatory Visit: Payer: Self-pay | Admitting: Family Medicine

## 2012-03-28 ENCOUNTER — Other Ambulatory Visit: Payer: Self-pay | Admitting: Family Medicine

## 2012-04-27 ENCOUNTER — Other Ambulatory Visit: Payer: Self-pay | Admitting: Family Medicine

## 2012-05-15 ENCOUNTER — Other Ambulatory Visit: Payer: Self-pay | Admitting: Family Medicine

## 2012-07-21 ENCOUNTER — Other Ambulatory Visit: Payer: Self-pay | Admitting: Family Medicine

## 2012-07-27 ENCOUNTER — Ambulatory Visit (INDEPENDENT_AMBULATORY_CARE_PROVIDER_SITE_OTHER): Payer: 59 | Admitting: Family Medicine

## 2012-07-27 ENCOUNTER — Encounter: Payer: Self-pay | Admitting: Family Medicine

## 2012-07-27 VITALS — BP 156/90 | HR 88 | Resp 16 | Wt 168.0 lb

## 2012-07-27 DIAGNOSIS — R7301 Impaired fasting glucose: Secondary | ICD-10-CM

## 2012-07-27 DIAGNOSIS — I10 Essential (primary) hypertension: Secondary | ICD-10-CM

## 2012-07-27 DIAGNOSIS — G43009 Migraine without aura, not intractable, without status migrainosus: Secondary | ICD-10-CM

## 2012-07-27 DIAGNOSIS — Z23 Encounter for immunization: Secondary | ICD-10-CM

## 2012-07-27 MED ORDER — METFORMIN HCL 500 MG PO TABS: 500.0000 mg | ORAL_TABLET | Freq: Every day | ORAL | Status: DC

## 2012-07-27 MED ORDER — LISINOPRIL 40 MG PO TABS: 40.0000 mg | ORAL_TABLET | Freq: Every day | ORAL | Status: DC

## 2012-07-27 MED ORDER — METOPROLOL TARTRATE 100 MG PO TABS: 100.0000 mg | ORAL_TABLET | Freq: Two times a day (BID) | ORAL | Status: DC

## 2012-07-27 NOTE — Progress Notes (Signed)
  Subjective:    Patient ID: Vanessa Valdez, female    DOB: 04/03/1966, 47 y.o.   MRN: [id number]  HPI  HTN f/u - Pt denies chest pain, SOB, dizziness, or heart palpitations.  Taking meds as directed w/o problems.  Denies medication side effects.  No Se on the medication. Was working out regularly until Oct and now has gained some weight.    Migraines-she says these are actually well controlled right now. She's not had any recent headaches.  Impaired fasting glucose. Then the most year and half since we checked her last A1c. She has continued to take her metformin and was working out until recently.    Review of Systems     Objective:   Physical Exam  Constitutional: She is oriented to person, place, and time. She appears well-developed and well-nourished.  HENT:  Head: Normocephalic and atraumatic.  Neck: Neck supple. No thyromegaly present.  Cardiovascular: Normal rate, regular rhythm and normal heart sounds.        No carotid bruit  Pulmonary/Chest: Effort normal and breath sounds normal.  Musculoskeletal: She exhibits no edema.  Lymphadenopathy:    She has no cervical adenopathy.  Neurological: She is alert and oriented to person, place, and time.  Skin: Skin is warm and dry.  Psychiatric: She has a normal mood and affect. Her behavior is normal.          Assessment & Plan:  HTN- uncontrolled. Needs to restart her medication. F.U. in 1 mo to recheck pressure. Get back on exercise routine encourage weight loss. Patient says she plans to start again. Refill sent to pharmacy for 90 supply.  IFG - still well controlled with an A1c of 5.7 which is stable. Continue metformin once a day since she's tolerating it well and it does help with weight loss. Followup in 6 months.  Migraines-currently well controlled.  Tdap up dated today.

## 2012-08-23 LAB — LIPID PANEL
Cholesterol: 207 mg/dL — ABNORMAL HIGH (ref 0–200)
Triglycerides: 87 mg/dL (ref <150)

## 2012-08-23 LAB — COMPLETE METABOLIC PANEL WITH GFR
AST: 20 U/L (ref 0–37)
BUN: 11 mg/dL (ref 6–23)
Calcium: 9.1 mg/dL (ref 8.4–10.5)
Chloride: 104 mEq/L (ref 96–112)
Creat: 0.81 mg/dL (ref 0.50–1.10)
GFR, Est African American: >89 mL/min
GFR, Est Non African American: 88 mL/min
Glucose, Bld: 92 mg/dL (ref 70–99)
Potassium: 4.6 mEq/L (ref 3.5–5.3)
Sodium: 139 mEq/L (ref 135–145)

## 2012-08-28 ENCOUNTER — Encounter: Payer: Self-pay | Admitting: Family Medicine

## 2012-08-28 ENCOUNTER — Ambulatory Visit (INDEPENDENT_AMBULATORY_CARE_PROVIDER_SITE_OTHER): Payer: 59 | Admitting: Family Medicine

## 2012-08-28 VITALS — BP 145/85 | HR 94 | Wt 168.0 lb

## 2012-08-28 DIAGNOSIS — I10 Essential (primary) hypertension: Secondary | ICD-10-CM

## 2012-08-28 NOTE — Progress Notes (Signed)
  Subjective:    Patient ID: Vanessa Valdez, female    DOB: [birth year], 47 y.o.   MRN: [id number]  HPI HTN- restarted BP med.  toleating well but did have one day where felt really tired and sluggish and Bp was low.  On ACE and betablocker.  Pt denies chest pain, SOB, dizziness, or heart palpitations.  Taking meds as directed w/o problems.  Denies medication side effects.  No regular exercise. She did take her medicines today but she had a very stressful day at work and has a headache right now.  Home BPs running 130/70s at home.     Review of Systems     Objective:   Physical Exam  Constitutional: She is oriented to person, place, and time. She appears well-developed and well-nourished.  HENT:  Head: Normocephalic and atraumatic.  Cardiovascular: Normal rate, regular rhythm and normal heart sounds.   Pulmonary/Chest: Effort normal and breath sounds normal.  Neurological: She is alert and oriented to person, place, and time.  Skin: Skin is warm and dry.  Psychiatric: She has a normal mood and affect. Her behavior is normal.          Assessment & Plan:  HTN - better today but uncontrolled.  Home BPs at goal. F/u in 4 mo.   Recommend schedule a CPE w/ pap soon.

## 2012-08-28 NOTE — Patient Instructions (Addendum)
Please schedule a physical with pap smear.

## 2012-10-31 ENCOUNTER — Other Ambulatory Visit: Payer: Self-pay | Admitting: Family Medicine

## 2012-12-25 ENCOUNTER — Ambulatory Visit: Payer: 59 | Admitting: Family Medicine

## 2013-01-06 ENCOUNTER — Other Ambulatory Visit: Payer: Self-pay | Admitting: Family Medicine

## 2013-01-09 ENCOUNTER — Encounter: Payer: Self-pay | Admitting: Family Medicine

## 2013-01-09 ENCOUNTER — Ambulatory Visit (INDEPENDENT_AMBULATORY_CARE_PROVIDER_SITE_OTHER): Payer: 59 | Admitting: Family Medicine

## 2013-01-09 VITALS — BP 156/87 | HR 61 | Wt 170.0 lb

## 2013-01-09 DIAGNOSIS — I10 Essential (primary) hypertension: Secondary | ICD-10-CM

## 2013-01-09 DIAGNOSIS — Z1231 Encounter for screening mammogram for malignant neoplasm of breast: Secondary | ICD-10-CM

## 2013-01-09 DIAGNOSIS — R7301 Impaired fasting glucose: Secondary | ICD-10-CM

## 2013-01-09 MED ORDER — HYDROCHLOROTHIAZIDE 12.5 MG PO CAPS: 12.5000 mg | ORAL_CAPSULE | Freq: Every day | ORAL | Status: DC

## 2013-01-09 NOTE — Addendum Note (Signed)
Addended by: Nani Gasser D on: 01/09/2013 05:08 PM   Modules accepted: Orders

## 2013-01-09 NOTE — Progress Notes (Addendum)
  Subjective:    Patient ID: Vanessa Valdez, female    DOB: 1966-02-09, 47 y.o.   MRN: [id number]  HPI HTN-  Pt denies chest pain, SOB, dizziness, or heart palpitations.  Taking meds as directed w/o problems.  Denies medication side effects.  She feels her blood pressure is up a little bit today because she just came from a very stressful meeting. She has been checking her home blood pressures and they're running right around 140-142 on the top and under 80 on the bottom.  IFG - no inc thrist or urination.    Review of Systems     Objective:   Physical Exam  Constitutional: She is oriented to person, place, and time. She appears well-developed and well-nourished.  HENT:  Head: Normocephalic and atraumatic.  Cardiovascular: Normal rate, regular rhythm and normal heart sounds.   Pulmonary/Chest: Effort normal and breath sounds normal.  Neurological: She is alert and oriented to person, place, and time.  Skin: Skin is warm and dry.  Psychiatric: She has a normal mood and affect. Her behavior is normal.          Assessment & Plan:  HTN - uncontrolled.  Will add hydrochlorothiazide to her regimen. She says she took this several years ago. She did not have any side effects or problems with it. Followup in 6 weeks to recheck blood pressure make sure still well controlled.  IFG - will recheck A1C. We'll call with results once available.  Her last Pap smear was 3 years ago but she says she has called today to make an appointment with Dr. Gala Romney. She does need an order for screening mammogram.

## 2013-01-29 ENCOUNTER — Other Ambulatory Visit: Payer: Self-pay | Admitting: Family Medicine

## 2013-02-20 ENCOUNTER — Ambulatory Visit: Payer: 59 | Admitting: Family Medicine

## 2013-03-02 ENCOUNTER — Ambulatory Visit: Payer: 59 | Admitting: Family Medicine

## 2013-03-09 ENCOUNTER — Ambulatory Visit (HOSPITAL_BASED_OUTPATIENT_CLINIC_OR_DEPARTMENT_OTHER): Payer: 59

## 2013-03-26 ENCOUNTER — Ambulatory Visit (HOSPITAL_BASED_OUTPATIENT_CLINIC_OR_DEPARTMENT_OTHER)
Admission: RE | Admit: 2013-03-26 | Discharge: 2013-03-26 | Disposition: A | Discharge status: Home / Self Care | Payer: 59 | Source: Ambulatory Visit | Attending: Family Medicine | Admitting: Family Medicine

## 2013-03-26 ENCOUNTER — Ambulatory Visit (INDEPENDENT_AMBULATORY_CARE_PROVIDER_SITE_OTHER): Payer: 59 | Admitting: Family Medicine

## 2013-03-26 ENCOUNTER — Encounter: Payer: Self-pay | Admitting: Family Medicine

## 2013-03-26 VITALS — BP 138/84 | HR 100 | Wt 172.0 lb

## 2013-03-26 DIAGNOSIS — Z23 Encounter for immunization: Secondary | ICD-10-CM

## 2013-03-26 DIAGNOSIS — I10 Essential (primary) hypertension: Secondary | ICD-10-CM

## 2013-03-26 DIAGNOSIS — Z1231 Encounter for screening mammogram for malignant neoplasm of breast: Secondary | ICD-10-CM

## 2013-03-26 DIAGNOSIS — R21 Rash and other nonspecific skin eruption: Secondary | ICD-10-CM

## 2013-03-26 LAB — BASIC METABOLIC PANEL WITH GFR
CO2: 25 mEq/L (ref 19–32)
Chloride: 104 mEq/L (ref 96–112)
Glucose, Bld: 86 mg/dL (ref 70–99)
Potassium: 4.5 mEq/L (ref 3.5–5.3)
Sodium: 137 mEq/L (ref 135–145)

## 2013-03-26 LAB — HEMOGLOBIN A1C
Hgb A1c MFr Bld: 5.8 % — ABNORMAL HIGH (ref <5.7)
Mean Plasma Glucose: 120 mg/dL — ABNORMAL HIGH (ref <117)

## 2013-03-26 MED ORDER — EPINEPHRINE 0.3 MG/0.3ML IJ SOAJ: 0.3000 mg | Freq: Once | INTRAMUSCULAR | Status: DC

## 2013-03-26 NOTE — Patient Instructions (Signed)
Ok to hold HCTZ while traveling

## 2013-03-26 NOTE — Progress Notes (Signed)
  Subjective:    Patient ID: Vanessa Valdez, female    DOB: May 14, 1966, 47 y.o.   MRN: [id number]  HPI HTN-  Pt denies chest pain, SOB, dizziness, or heart palpitations.  Taking meds as directed w/o problems.  Denies medication side effects.  She has recently started working out to lose weight.   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  She's noticed over the last couple years but when she garlic or something was broken and she tends to vomit. The last time that she ate something she noticed a rash on the backs of her hands. She says she took some Benadryl by the next morning to have resolved. She wants to know she should be tested. Interestingly she does have a nephew with severe food allergies.  No rash today.  She denies any shortness of breath or swelling of the tongue or mouth.  Review of Systems     Objective:   Physical Exam  Constitutional: She is oriented to person, place, and time. She appears well-developed and well-nourished.  HENT:  Head: Normocephalic and atraumatic.  Cardiovascular: Normal rate, regular rhythm and normal heart sounds.   Pulmonary/Chest: Effort normal and breath sounds normal.  Neurological: She is alert and oriented to person, place, and time.  Skin: Skin is warm and dry.  Psychiatric: She has a normal mood and affect. Her behavior is normal.          Assessment & Plan:  HTN - well controlled. Labs look great. wanred about sunsensiviti.  F/U in 6 months.  Work on diet and exercise. OK to stop the diuretic for one week while she is traveling in [place] to avoid dehydration. She went for labs this morning her potassium and kidney function are well controlled.  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 I did go ahead and give her prescription for an EpiPen just to have on hand especially if it happens again and she notices any shortness of breath or swelling in the tongue or mouth. We could certainly refer her to an allergist if she would like formal testing as  well. She says she will think about it and let me know.

## 2013-03-27 ENCOUNTER — Other Ambulatory Visit: Payer: Self-pay | Admitting: Family Medicine

## 2013-03-27 NOTE — Progress Notes (Signed)
Quick Note:  All labs are normal. ______ 

## 2013-03-28 ENCOUNTER — Other Ambulatory Visit: Payer: Self-pay | Admitting: Family Medicine

## 2013-03-28 DIAGNOSIS — R928 Other abnormal and inconclusive findings on diagnostic imaging of breast: Secondary | ICD-10-CM

## 2013-03-30 ENCOUNTER — Ambulatory Visit
Admission: RE | Admit: 2013-03-30 | Discharge: 2013-03-30 | Disposition: A | Discharge status: Home / Self Care | Payer: 59 | Source: Ambulatory Visit | Attending: Family Medicine | Admitting: Family Medicine

## 2013-03-30 DIAGNOSIS — R928 Other abnormal and inconclusive findings on diagnostic imaging of breast: Secondary | ICD-10-CM

## 2013-03-31 ENCOUNTER — Other Ambulatory Visit: Payer: Self-pay | Admitting: Family Medicine

## 2013-05-30 ENCOUNTER — Other Ambulatory Visit: Payer: Self-pay | Admitting: Family Medicine

## 2013-06-29 ENCOUNTER — Other Ambulatory Visit: Payer: Self-pay | Admitting: Family Medicine

## 2013-07-16 ENCOUNTER — Other Ambulatory Visit: Payer: Self-pay | Admitting: Family Medicine

## 2013-08-17 ENCOUNTER — Other Ambulatory Visit: Payer: Self-pay | Admitting: Family Medicine

## 2013-09-24 ENCOUNTER — Ambulatory Visit: Payer: 59 | Admitting: Family Medicine

## 2013-10-08 ENCOUNTER — Other Ambulatory Visit: Payer: Self-pay | Admitting: Family Medicine

## 2013-10-12 ENCOUNTER — Ambulatory Visit: Payer: 59 | Admitting: Family Medicine

## 2013-10-18 ENCOUNTER — Ambulatory Visit: Payer: 59 | Admitting: Family Medicine

## 2013-11-06 ENCOUNTER — Other Ambulatory Visit: Payer: Self-pay | Admitting: Family Medicine

## 2013-11-15 ENCOUNTER — Ambulatory Visit: Payer: 59 | Admitting: Family Medicine

## 2013-12-13 ENCOUNTER — Other Ambulatory Visit: Payer: Self-pay | Admitting: Family Medicine

## 2013-12-23 ENCOUNTER — Other Ambulatory Visit: Payer: Self-pay | Admitting: Family Medicine

## 2014-01-02 ENCOUNTER — Other Ambulatory Visit: Payer: Self-pay | Admitting: Family Medicine

## 2014-01-08 ENCOUNTER — Ambulatory Visit (INDEPENDENT_AMBULATORY_CARE_PROVIDER_SITE_OTHER): Payer: 59 | Admitting: Family Medicine

## 2014-01-08 ENCOUNTER — Encounter: Payer: Self-pay | Admitting: Family Medicine

## 2014-01-08 VITALS — BP 143/89 | HR 78 | Ht 63.0 in | Wt 176.0 lb

## 2014-01-08 DIAGNOSIS — R7301 Impaired fasting glucose: Secondary | ICD-10-CM

## 2014-01-08 DIAGNOSIS — I10 Essential (primary) hypertension: Secondary | ICD-10-CM

## 2014-01-08 LAB — POCT GLYCOSYLATED HEMOGLOBIN (HGB A1C): HEMOGLOBIN A1C: 5.9

## 2014-01-08 MED ORDER — METOPROLOL TARTRATE 100 MG PO TABS: ORAL_TABLET | ORAL | Status: DC

## 2014-01-08 MED ORDER — LISINOPRIL 40 MG PO TABS: ORAL_TABLET | ORAL | Status: DC

## 2014-01-08 MED ORDER — METFORMIN HCL 500 MG PO TABS: ORAL_TABLET | ORAL | Status: DC

## 2014-01-08 NOTE — Progress Notes (Signed)
   Subjective:    Patient ID: Vanessa Valdez, female    DOB: 11-Sep-1965, 48 y.o.   MRN: [id number]  HPI Hypertension- Pt denies chest pain, SOB, dizziness, or heart palpitations.  Taking meds as directed w/o problems.  Denies medication side effects.  She was recently seen for dehydration and acute renal failure. [place] very dehydr[place]. She did recover.  Impaired fasting glucose-she has been taking metformin consistently. No side effects or problems with the medication. Last A1c was in September. Lab Results  Component Value Date   HGBA1C 5.8* 03/26/2013      Review of Systems     Objective:   Physical Exam  Constitutional: She is oriented to person, place, and time. She appears well-developed and well-nourished.  HENT:  Head: Normocephalic and atraumatic.  Neck: Neck supple. No thyromegaly present.  Cardiovascular: Normal rate, regular rhythm and normal heart sounds.   Pulmonary/Chest: Effort normal and breath sounds normal.  Neurological: She is alert and oriented to person, place, and time.  Skin: Skin is warm and dry.  Psychiatric: She has a normal mood and affect. Her behavior is normal.          Assessment & Plan:  HTN - well controlled. F/U in 6 months.  Due for CMP and lipids.   IFG - hemoglobin A1c is 5.9 today. Stable from September of last year. Continue metformin and continue work on diet and exercise. Followup in 6 months.

## 2014-01-21 ENCOUNTER — Other Ambulatory Visit: Payer: Self-pay | Admitting: Family Medicine

## 2014-02-01 LAB — COMPLETE METABOLIC PANEL WITH GFR
ALK PHOS: 88 U/L (ref 39–117)
ALT: 28 U/L (ref 0–35)
AST: 23 U/L (ref 0–37)
Albumin: 3.9 g/dL (ref 3.5–5.2)
BILIRUBIN TOTAL: 0.6 mg/dL (ref 0.2–1.2)
BUN: 12 mg/dL (ref 6–23)
CO2: 25 mEq/L (ref 19–32)
Calcium: 9.1 mg/dL (ref 8.4–10.5)
Chloride: 101 mEq/L (ref 96–112)
Creat: 0.76 mg/dL (ref 0.50–1.10)
GFR, Est Non African American: >89 mL/min
Glucose, Bld: 92 mg/dL (ref 70–99)
Potassium: 4.4 mEq/L (ref 3.5–5.3)
SODIUM: 137 meq/L (ref 135–145)
TOTAL PROTEIN: 7.1 g/dL (ref 6.0–8.3)

## 2014-02-01 LAB — LIPID PANEL
CHOL/HDL RATIO: 2.9 ratio
Cholesterol: 207 mg/dL — ABNORMAL HIGH (ref 0–200)
HDL: 72 mg/dL (ref >39)
LDL CALC: 115 mg/dL — AB (ref 0–99)
Triglycerides: 102 mg/dL (ref <150)
VLDL: 20 mg/dL (ref 0–40)

## 2014-02-02 ENCOUNTER — Other Ambulatory Visit: Payer: Self-pay | Admitting: Family Medicine

## 2014-02-14 ENCOUNTER — Other Ambulatory Visit: Payer: Self-pay | Admitting: *Deleted

## 2014-02-14 MED ORDER — METOPROLOL TARTRATE 100 MG PO TABS: ORAL_TABLET | ORAL | Status: DC

## 2014-02-14 NOTE — Telephone Encounter (Signed)
Metoprolol refill sent to [facility]. Corliss SkainsJamie Zaniel Marineau, CMA

## 2014-02-18 ENCOUNTER — Other Ambulatory Visit: Payer: Self-pay | Admitting: *Deleted

## 2014-02-18 MED ORDER — HYDROCHLOROTHIAZIDE 12.5 MG PO CAPS: ORAL_CAPSULE | ORAL | Status: DC

## 2014-06-26 ENCOUNTER — Other Ambulatory Visit: Payer: Self-pay | Admitting: Family Medicine

## 2014-07-09 ENCOUNTER — Ambulatory Visit: Payer: 59 | Admitting: Family Medicine

## 2014-07-10 ENCOUNTER — Ambulatory Visit: Payer: 59 | Admitting: Family Medicine

## 2014-07-22 ENCOUNTER — Other Ambulatory Visit: Payer: Self-pay | Admitting: Family Medicine

## 2014-07-30 ENCOUNTER — Emergency Department (INDEPENDENT_AMBULATORY_CARE_PROVIDER_SITE_OTHER)
Admission: EM | Admit: 2014-07-30 | Discharge: 2014-07-30 | Disposition: A | Discharge status: Home / Self Care | Payer: 59 | Source: Home / Self Care | Attending: Emergency Medicine | Admitting: Emergency Medicine

## 2014-07-30 ENCOUNTER — Encounter: Payer: Self-pay | Admitting: Emergency Medicine

## 2014-07-30 DIAGNOSIS — J209 Acute bronchitis, unspecified: Secondary | ICD-10-CM

## 2014-07-30 HISTORY — DX: Type 2 diabetes mellitus without complications: E11.9

## 2014-07-30 MED ORDER — BENZONATATE 200 MG PO CAPS: ORAL_CAPSULE | ORAL | Status: DC

## 2014-07-30 MED ORDER — PROMETHAZINE-DM 6.25-15 MG/5ML PO SYRP: ORAL_SOLUTION | ORAL | Status: DC

## 2014-07-30 MED ORDER — AZITHROMYCIN 250 MG PO TABS: ORAL_TABLET | ORAL | Status: DC

## 2014-07-30 NOTE — ED Notes (Signed)
Cough, runny nose, congestion, body aches x 1 week, nausea, vomited x 4 in last 24 hours

## 2014-07-30 NOTE — ED Provider Notes (Signed)
CSN: [id number]     Arrival date & time 07/30/14  1001 History   First MD Initiated Contact with Patient 07/30/14 1010     Chief Complaint  Patient presents with  . Cough   (Consider location/radiation/quality/duration/timing/severity/associated sxs/prior Treatment) HPI URI HISTORY  Vanessa Valdez is a 49 y.o. female who complains of onset of cold symptoms for 7 days. Hacking cough which keeps her up at night and occasionally induces vomiting. No other GI symptoms.   Progressively worsening . Have been using over-the-counter treatment which is not helping.  No chills/sweats +  Fever, initially.  + Minimal Nasal congestion +  Minimal Discolored Post-nasal drainage No sinus pain/pressure No sore throat  +  cough No wheezing Positive chest congestion No hemoptysis No shortness of breath No pleuritic pain  No itchy/red eyes No earache  Had nausea, resolved after vomiting Vomited 4 in the past 24 hours after hacking cough episodes No abdominal pain No diarrhea Denies chance of pregnancy. Last normal menstrual period 07/24/2014  No skin rashes +  Fatigue No myalgias No headache   Past Medical History  Diagnosis Date  . Migraines   . Hypertension 1997  . Allergy   . Insomnia   . Preeclampsia   . Depression   . Impaired fasting glucose   . Diabetes mellitus without complication    History reviewed. No pertinent past surgical history. Family History  Problem Relation Age of Onset  . Hypertension Mother   . Diabetes Father   . Cancer Paternal Grandmother 44    colon  . Cancer Paternal Grandfather     myleoma   History  Substance Use Topics  . Smoking status: Never Smoker   . Smokeless tobacco: Not on file  . Alcohol Use: Yes   OB History    No data available     Review of Systems  All other systems reviewed and are negative.   Allergies  Codeine sulfate; Penicillins; and Garlic  Home Medications   Prior to Admission medications   Medication Sig Start  Date End Date Taking? Authorizing Provider  azithromycin (ZITHROMAX Z-PAK) 250 MG tablet Take 2 tablets on day one, then 1 tablet daily on days 2 through 5 07/30/14   Lajean Manes, MD  benzonatate (TESSALON) 200 MG capsule Take 1 every 8 hours as needed for cough. 07/30/14   Lajean Manes, MD  cetirizine (ZYRTEC) 10 MG tablet Take 10 mg by mouth every evening.      Historical Provider, MD  EPINEPHrine (EPIPEN 2-PAK) 0.3 mg/0.3 mL SOAJ injection Inject 0.3 mLs (0.3 mg total) into the muscle once. 03/26/13   Agapito Games, MD  hydrochlorothiazide (MICROZIDE) 12.5 MG capsule TAKE ONE CAPSULE BY MOUTH DAILY 02/18/14   Agapito Games, MD  hydrochlorothiazide (MICROZIDE) 12.5 MG capsule TAKE 1 CAPSULE BY MOUTH EVERY DAY 07/22/14   Agapito Games, MD  lisinopril (PRINIVIL,ZESTRIL) 40 MG tablet TAKE 1 TABLET BY MOUTH EVERY DAY 06/26/14   Agapito Games, MD  metFORMIN (GLUCOPHAGE) 500 MG tablet TAKE 1 TABLET BY MOUTH EVERY DAY WITH BREAKFAST    Agapito Games, MD  metoprolol (LOPRESSOR) 100 MG tablet TAKE 1 TABLET BY MOUTH TWICE DAILY 02/14/14   Agapito Games, MD  metoprolol (LOPRESSOR) 100 MG tablet TAKE 1 TABLET BY MOUTH TWICE DAILY 07/22/14   Agapito Games, MD  promethazine-dextromethorphan (PROMETHAZINE-DM) 6.25-15 MG/5ML syrup 10 ml po every 8 hours as needed for cough or nausea. May cause drowsiness 07/30/14   Lajean Manes, MD  BP 163/101 mmHg  Pulse 90  Temp(Src) 98.1 F (36.7 C) (Oral)  Ht 5\' 3"  (1.6 m)  Wt 186 lb (84.369 kg)  BMI 32.96 kg/m2  SpO2 96%  LMP 07/24/2014 Physical Exam  Constitutional: She is oriented to person, place, and time. She appears well-developed and well-nourished. No distress.  HENT:  Head: Normocephalic and atraumatic.  Right Ear: Tympanic membrane normal.  Left Ear: Tympanic membrane normal.  Nose: Nose normal.  Mouth/Throat: Oropharynx is clear and moist. No oropharyngeal exudate.  Eyes: Right eye exhibits no discharge. Left eye  exhibits no discharge. No scleral icterus.  Neck: Neck supple.  Cardiovascular: Normal rate, regular rhythm and normal heart sounds.   Pulmonary/Chest: No respiratory distress. She has no wheezes. She has rhonchi. She has no rales.  Abdominal: Soft. She exhibits no distension. There is no tenderness.  Musculoskeletal: She exhibits no edema or tenderness.  Lymphadenopathy:    She has no cervical adenopathy.  Neurological: She is alert and oriented to person, place, and time. No cranial nerve deficit.  Skin: Skin is warm and dry. No rash noted.  Psychiatric: She has a normal mood and affect.  Nursing note and vitals reviewed.   ED Course  Procedures (including critical care time) Labs Review Labs Reviewed - No data to display  Imaging Review No results found.   MDM   1. Acute bronchitis, unspecified organism    no evidence of primary GI problem. No active vomiting at this time.  Treatment options discussed, as well as risks, benefits, alternatives. Patient voiced understanding and agreement with the following plans: Z-Pak Tessalon Perles Promethazine DM as needed for cough and nausea Push clear liquids and advance diet as tolerated Follow-up with your primary care doctor in 2-3 days if not improving, or sooner if symptoms become worse. Precautions discussed. Red flags discussed. Questions invited and answered. Patient voiced understanding and agreement.      Lajean Manesavid Massey, MD 07/30/14 1044

## 2014-08-01 ENCOUNTER — Encounter: Payer: Self-pay | Admitting: Family Medicine

## 2014-08-01 ENCOUNTER — Ambulatory Visit (INDEPENDENT_AMBULATORY_CARE_PROVIDER_SITE_OTHER): Payer: 59 | Admitting: Family Medicine

## 2014-08-01 VITALS — BP 136/87 | HR 92 | Ht 63.0 in | Wt 185.0 lb

## 2014-08-01 DIAGNOSIS — R7301 Impaired fasting glucose: Secondary | ICD-10-CM

## 2014-08-01 DIAGNOSIS — I10 Essential (primary) hypertension: Secondary | ICD-10-CM

## 2014-08-01 LAB — POCT GLYCOSYLATED HEMOGLOBIN (HGB A1C): Hemoglobin A1C: 6.4

## 2014-08-01 MED ORDER — EPINEPHRINE 0.3 MG/0.3ML IJ SOAJ: 0.3000 mg | Freq: Once | INTRAMUSCULAR | Status: DC

## 2014-08-01 MED ORDER — METFORMIN HCL 500 MG PO TABS: ORAL_TABLET | ORAL | Status: DC

## 2014-08-01 MED ORDER — HYDROCHLOROTHIAZIDE 12.5 MG PO CAPS: ORAL_CAPSULE | ORAL | Status: DC

## 2014-08-01 MED ORDER — LISINOPRIL 40 MG PO TABS: 40.0000 mg | ORAL_TABLET | Freq: Every day | ORAL | Status: DC

## 2014-08-01 MED ORDER — HYDROCHLOROTHIAZIDE 25 MG PO TABS: 25.0000 mg | ORAL_TABLET | Freq: Every day | ORAL | Status: DC

## 2014-08-01 MED ORDER — METOPROLOL TARTRATE 100 MG PO TABS: ORAL_TABLET | ORAL | Status: DC

## 2014-08-01 NOTE — Progress Notes (Signed)
   Subjective:    Patient ID: Vanessa HausenDawn W Kohli, female    DOB: December 27, 1965, 49 y.o.   MRN: [id number]  HPI  Hypertension- Pt denies chest pain, SOB, dizziness, or heart palpitations.  Taking meds as directed w/o problems.  Denies medication side effects.  Home BPs running 140s/70s at home. Hasn't been working out for about a month.    IFG - No inc thrist or urination.    Review of Systems     Objective:   Physical Exam  Constitutional: She is oriented to person, place, and time. She appears well-developed and well-nourished.  HENT:  Head: Normocephalic and atraumatic.  Right Ear: External ear normal.  Left Ear: External ear normal.  Nose: Nose normal.  Mouth/Throat: Oropharynx is clear and moist.     Eyes: Conjunctivae and EOM are normal. Pupils are equal, round, and reactive to light.  Neck: Neck supple. No thyromegaly present.  Cardiovascular: Normal rate, regular rhythm and normal heart sounds.   Pulmonary/Chest: Effort normal and breath sounds normal. She has no wheezes.  Lymphadenopathy:    She has no cervical adenopathy.  Neurological: She is alert and oriented to person, place, and time.  Skin: Skin is warm and dry.  Psychiatric: She has a normal mood and affect.          Assessment & Plan:  IFG - Up from last OV.  A1C is 6.4. F/U in 3 months.  Work on diet and exercise. She says she plans on getting back on track and going to the gym again. This should make a big difference.  Discussion that she is borderline for diabetes in that we may need to consider starting a medication if she's not able to get her A1c down by the next office visit.  HTN - inc hctz dose to 25mg .  encouraged her to go to the lab for a BMP in about 1-2 weeks after she starts the increased dose of hydrochlorothiazide to monitor potassium and kidney function. Weight loss would make a big difference in her BPs.   Follow-up in 3 months.

## 2014-08-15 ENCOUNTER — Other Ambulatory Visit: Payer: Self-pay | Admitting: Family Medicine

## 2014-09-05 ENCOUNTER — Other Ambulatory Visit: Payer: Self-pay | Admitting: Family Medicine

## 2014-09-23 ENCOUNTER — Other Ambulatory Visit: Payer: Self-pay | Admitting: Family Medicine

## 2014-10-31 ENCOUNTER — Ambulatory Visit: Payer: 59 | Admitting: Family Medicine

## 2014-11-25 ENCOUNTER — Ambulatory Visit: Payer: 59 | Admitting: Family Medicine

## 2014-12-03 ENCOUNTER — Encounter: Payer: Self-pay | Admitting: Family Medicine

## 2014-12-03 ENCOUNTER — Ambulatory Visit (INDEPENDENT_AMBULATORY_CARE_PROVIDER_SITE_OTHER): Payer: 59 | Admitting: Family Medicine

## 2014-12-03 VITALS — BP 132/84 | HR 94 | Wt 186.0 lb

## 2014-12-03 DIAGNOSIS — R635 Abnormal weight gain: Secondary | ICD-10-CM | POA: Diagnosis not present

## 2014-12-03 DIAGNOSIS — E66811 Obesity, class 1: Secondary | ICD-10-CM | POA: Insufficient documentation

## 2014-12-03 DIAGNOSIS — R7301 Impaired fasting glucose: Secondary | ICD-10-CM

## 2014-12-03 DIAGNOSIS — E669 Obesity, unspecified: Secondary | ICD-10-CM

## 2014-12-03 DIAGNOSIS — I10 Essential (primary) hypertension: Secondary | ICD-10-CM | POA: Diagnosis not present

## 2014-12-03 LAB — POCT GLYCOSYLATED HEMOGLOBIN (HGB A1C): HEMOGLOBIN A1C: 6.2

## 2014-12-03 NOTE — Patient Instructions (Signed)
[employer] - smart phone app   Phentermine; Topiramate extended-release capsules What is this medicine? Phentermine; topiramate (FEN ter meen; toe PYRE a mate) is a combination of two medicines used with a reduced calorie diet and exercise to help you lose weight. This medicine is only available through certified pharmacies enrolled in a special program. Your healthcare professional will tell you where you can get your medicine. If you have additional questions, you can visit the manufacture's website at [email or web address] or contact them by phone at [phone]. This medicine may be used for other purposes; ask your health care provider or pharmacist if you have questions. COMMON BRAND NAME(S): Qsymia What should I tell my health care provider before I take this medicine? They need to know if you have any of these conditions: -agitation -diarrhea -depression or other mental illness -diabetes -glaucoma -heart disease -high or low blood pressure -history of anorexia or other eating disorder -history of substance abuse -kidney stones or kidney disease -liver disease -lung disease like asthma, obstructive pulmonary disease, emphysema -metabolic acidosis -on a ketogenic diet -scheduled for surgery or a procedure -suicidal thoughts, plans, or attempt; a previous suicide attempt by you or a family member -taken an MAOI like Carbex, Eldepryl, Marplan, Nardil, or Parnate in last 14 days -thyroid disease -an unusual or allergic reaction to phentermine, topiramate, other medicines, foods, dyes, or preservatives -pregnant or trying to get pregnant -breast-feeding How should I use this medicine? Take this medicine by mouth with a glass of water. Follow the directions on the prescription label. Do not crush or chew. This medicine is usually taken with or without food once per day in the morning. Avoid taking this medicine in the evening. It may interfere with sleep. Take your doses at  regular intervals. Do not take your medicine more often than directed. A special MedGuide will be given to you by the pharmacist with each prescription and refill. Be sure to read this information carefully each time. Talk to your pediatrician regarding the use of this medicine in children. Special care may be needed. Overdosage: If you think you've taken too much of this medicine contact a poison control center or emergency room at once. Overdosage: If you think you have taken too much of this medicine contact a poison control center or emergency room at once. NOTE: This medicine is only for you. Do not share this medicine with others. What if I miss a dose? If you miss a dose, take it as soon as you can. If it is almost time for your next dose, take only that dose. Do not take double or extra doses. What may interact with this medicine? Do not take this medicine with any of the following medications: -MAOIs like Carbex, Eldepryl, Marplan, Nardil, and ParnateThis medicine may also interact with the following medications: -acetazolamide -amitriptyline -antihistamines for allergy, cough and cold -atropine -birth control pills -carbamazepine -certain medicines for bladder problems like oxybutynin, tolterodine -certain medicines for depression, anxiety, or psychotic disturbances -certain medicines for Parkinson's disease like benztropine, trihexyphenidyl -certain medicines for stomach problems like dicyclomine, hyoscyamine -certain medicines for travel sickness like scopolamine -dichlorphenamide -digoxin -diltiazem -diuretics -hydrochlorothiazide -ipratropium -lithium -medicines for diabetes -medicines for pain, sleep, or muscle relaxation -methazolamide -phenytoin -pioglitazone -stimulant medicines for attention disorders, weight loss, or to stay awake -valproic acid -zonisamide This list may not describe all possible interactions. Give your health care provider a list of all the  medicines, herbs, non-prescription drugs, or dietary supplements you use. Also  tell them if you smoke, drink alcohol, or use illegal drugs. Some items may interact with your medicine. What should I watch for while using this medicine? Visit your doctor or health care professional for regular checks on your progress. This medicine is intended to be used in addition to a healthy diet and appropriate exercise. The best results are achieved this way. Do not increase or in any way change your dose without consulting your doctor or health care professional. Do not take this medicine within 6 hours of bedtime. It can keep you from getting to sleep. Avoid drinks that contain caffeine and try to stick to a regular bedtime every night. Do not stop taking this medicine suddenly. This increases the risk of seizures. This medicine can decrease sweating and increase your body temperature. Watch for signs of deceased sweating or fever. Avoid extreme heat, hot baths, and saunas. Be careful about exercising, especially in hot weather. Contact your health care provider right away if you notice a fever or decrease in sweating. You should drink plenty of fluids while taking this medicine. If you have had kidney stones in the past, this will help to reduce your chances of forming kidney stones. If you have stomach pain, with nausea or vomiting and yellowing of your eyes or skin, call your doctor immediately. You may get drowsy or dizzy. Do not drive, use machinery, or do anything that needs mental alertness until you know how this medicine affects you. Do not stand or sit up quickly, especially if you are an older patient. This reduces the risk of dizzy or fainting spells. Alcohol may increase dizziness and drowsiness. Avoid alcoholic drinks. This medicine may affect blood sugar levels. If you have diabetes, check with your doctor or health care professional before you change your diet or the dose of your diabetic  medicine. Patients and their families should watch out for worsening depression or thoughts of suicide. Also watch out for sudden changes in feelings such as feeling anxious, agitated, panicky, irritable, hostile, aggressive, impulsive, severely restless, overly excited and hyperactive, or not being able to sleep. If this happens, especially at the beginning of treatment or after a change in dose, call your health care professional. If you notice blurred vision, eye pain, or other eye problems, seek medical attention at once for an eye exam. This medicine may increase the chance of developing metabolic acidosis. If left untreated, this can cause kidney stones, bone disease, or slowed growth in children. Symptoms include breathing fast, fatigue, loss of appetite, irregular heartbeat, or loss of consciousness. Call your doctor immediately if you experience any of these side effects. Also, tell your doctor about any surgery you plan on having while taking this medicine since this may increase your risk for metabolic acidosis. Women who become pregnant while using this medicine should contact their physician immediately. You should also contact The [employer] Pregnancy Surveillance Program which is a program that monitors pregnancies that occur during treatment. Contact the program by calling [phone]. What side effects may I notice from receiving this medicine? Side effects that you should report to your doctor or health care professional as soon as possible: -allergic reactions like skin rash, itching or hives, swelling of the face, lips, or tongue -blood in the urine -changes in vision -chest pain or chest tightness -confusion -depressed mood -difficulty breathing -dizziness -fast or irregular heartbeat -feeling anxious -irritable -loss of appetite -low blood pressure -pain in the lower back or side -pain, tingling, numbness in the hands  or feet -pain when urinating -palpitations -redness,  blistering, peeling or loosening of the skin, including inside the mouth -shortness of breath -suicidal thoughts or other mood changes -trouble passing urine or change in the amount of urine -trouble walking, dizziness, loss of balance or coordination -unusually weak or tired -vomiting Side effects that usually do not require medical attention (Report these to your doctor or health care professional if they continue or are bothersome.): -change in sex drive or performance -changes in vision -constipation -diarrhea -dry mouth -headache -nausea -tremors -trouble sleeping -upset stomach This list may not describe all possible side effects. Call your doctor for medical advice about side effects. You may report side effects to FDA at 1-800-FDA-1088. Where should I keep my medicine? Keep out of the reach of children. This medicine can be abused. Keep your medicine in a safe place to protect it from theft. Do not share this medicine with anyone. Selling or giving away this medicine is dangerous and against the law. Store at room temperature between 15 and 25 degrees C (59 and 77 degrees F). Throw away any unused medicine after the expiration date. NOTE: This sheet is a summary. It may not cover all possible information. If you have questions about this medicine, talk to your doctor, pharmacist, or health care provider.  2015, [employer] Standard. (2011-02-08 13:56:53) Phentermine tablets or capsules What is this medicine? PHENTERMINE (FEN ter meen) decreases your appetite. It is used with a reduced calorie diet and exercise to help you lose weight. This medicine may be used for other purposes; ask your health care provider or pharmacist if you have questions. COMMON BRAND NAME(S): Adipex-P, Atti-Plex P, Atti-Plex P Spansule, Fastin, Pro-Fast, Tara-8 What should I tell my health care provider before I take this medicine? They need to know if you have any of these  conditions: -agitation -glaucoma -heart disease -high blood pressure -history of substance abuse -lung disease called Primary Pulmonary Hypertension (PPH) -taken an MAOI like Carbex, Eldepryl, Marplan, Nardil, or Parnate in last 14 days -thyroid disease -an unusual or allergic reaction to phentermine, other medicines, foods, dyes, or preservatives -pregnant or trying to get pregnant -breast-feeding How should I use this medicine? Take this medicine by mouth with a glass of water. Follow the directions on the prescription label. This medicine is usually taken 30 minutes before or 1 to 2 hours after breakfast. Avoid taking this medicine in the evening. It may interfere with sleep. Take your doses at regular intervals. Do not take your medicine more often than directed. Talk to your pediatrician regarding the use of this medicine in children. Special care may be needed. Overdosage: If you think you have taken too much of this medicine contact a poison control center or emergency room at once. NOTE: This medicine is only for you. Do not share this medicine with others. What if I miss a dose? If you miss a dose, take it as soon as you can. If it is almost time for your next dose, take only that dose. Do not take double or extra doses. What may interact with this medicine? Do not take this medicine with any of the following medications: -duloxetine -MAOIs like Carbex, Eldepryl, Marplan, Nardil, and Parnate -medicines for colds or breathing difficulties like pseudoephedrine or phenylephrine -procarbazine -sibutramine -SSRIs like citalopram, escitalopram, fluoxetine, fluvoxamine, paroxetine, and sertraline -stimulants like dexmethylphenidate, methylphenidate or modafinil -venlafaxine This medicine may also interact with the following medications: -medicines for diabetes This list may not describe all possible interactions.  Give your health care provider a list of all the medicines, herbs,  non-prescription drugs, or dietary supplements you use. Also tell them if you smoke, drink alcohol, or use illegal drugs. Some items may interact with your medicine. What should I watch for while using this medicine? Notify your physician immediately if you become short of breath while doing your normal activities. Do not take this medicine within 6 hours of bedtime. It can keep you from getting to sleep. Avoid drinks that contain caffeine and try to stick to a regular bedtime every night. This medicine was intended to be used in addition to a healthy diet and exercise. The best results are achieved this way. This medicine is only indicated for short-term use. Eventually your weight loss may level out. At that point, the drug will only help you maintain your new weight. Do not increase or in any way change your dose without consulting your doctor. You may get drowsy or dizzy. Do not drive, use machinery, or do anything that needs mental alertness until you know how this medicine affects you. Do not stand or sit up quickly, especially if you are an older patient. This reduces the risk of dizzy or fainting spells. Alcohol may increase dizziness and drowsiness. Avoid alcoholic drinks. What side effects may I notice from receiving this medicine? Side effects that you should report to your doctor or health care professional as soon as possible: -chest pain, palpitations -depression or severe changes in mood -increased blood pressure -irritability -nervousness or restlessness -severe dizziness -shortness of breath -problems urinating -unusual swelling of the legs -vomiting Side effects that usually do not require medical attention (report to your doctor or health care professional if they continue or are bothersome): -blurred vision or other eye problems -changes in sexual ability or desire -constipation or diarrhea -difficulty sleeping -dry mouth or unpleasant taste -headache -nausea This list  may not describe all possible side effects. Call your doctor for medical advice about side effects. You may report side effects to FDA at 1-800-FDA-1088. Where should I keep my medicine? Keep out of the reach of children. This medicine can be abused. Keep your medicine in a safe place to protect it from theft. Do not share this medicine with anyone. Selling or giving away this medicine is dangerous and against the law. Store at room temperature between 20 and 25 degrees C (68 and 77 degrees F). Keep container tightly closed. Throw away any unused medicine after the expiration date. NOTE: This sheet is a summary. It may not cover all possible information. If you have questions about this medicine, talk to your doctor, pharmacist, or health care provider.  2015, [employer] Standard. (2010-08-19 11:02:44) Liraglutide injection (Weight Management) What is this medicine? LIRAGLUTIDE (LIR a GLOO tide) is used with a reduced calorie diet and exercise to help you lose weight. This medicine may be used for other purposes; ask your health care provider or pharmacist if you have questions. COMMON BRAND NAME(S): Saxenda What should I tell my health care provider before I take this medicine? They need to know if you have any of these conditions: -endocrine tumors (MEN 2) or if someone in your family had these tumors -gallstones -high cholesterol -history of alcohol abuse problem -history of pancreatitis -kidney disease or if you are on dialysis -liver disease -previous swelling of the tongue, face, or lips with difficulty breathing, difficulty swallowing, hoarseness, or tightening of the throat -stomach problems -suicidal thoughts, plans, or attempt; a previous suicide attempt by  you or a family member -thyroid cancer or if someone in your family had thyroid cancer -an unusual or allergic reaction to liraglutide, medicines, foods, dyes, or preservatives -pregnant or trying to get  pregnant -breast-feeding How should I use this medicine? This medicine is for injection under the skin of your upper leg, stomach area, or upper arm. You will be taught how to prepare and give this medicine. Use exactly as directed. Take your medicine at regular intervals. Do not take it more often than directed. It is important that you put your used needles and syringes in a special sharps container. Do not put them in a trash can. If you do not have a sharps container, call your pharmacist or healthcare provider to get one. A special MedGuide will be given to you by the pharmacist with each prescription and refill. Be sure to read this information carefully each time. Talk to your pediatrician regarding the use of this medicine in children. Special care may be needed. Overdosage: If you think you've taken too much of this medicine contact a poison control center or emergency room at once. Overdosage: If you think you have taken too much of this medicine contact a poison control center or emergency room at once. NOTE: This medicine is only for you. Do not share this medicine with others. What if I miss a dose? If you miss a dose, take it as soon as you can. If it is almost time for your next dose, take only that dose. Do not take double or extra doses. If you miss your dose for 3 days or more, call your doctor or health care professional to talk about how to restart this medicine. What may interact with this medicine? -acetaminophen -atorvastatin -birth control pills -digoxin -griseofulvin -lisinopril This list may not describe all possible interactions. Give your health care provider a list of all the medicines, herbs, non-prescription drugs, or dietary supplements you use. Also tell them if you smoke, drink alcohol, or use illegal drugs. Some items may interact with your medicine. What should I watch for while using this medicine? Visit your doctor or health care professional for regular  checks on your progress. This medicine is intended to be used in addition to a healthy diet and appropriate exercise. The best results are achieved this way. Do not increase or in any way change your dose without consulting your doctor or health care professional. This medicine may affect blood sugar levels. If you have diabetes, check with your doctor or health care professional before you change your diet or the dose of your diabetic medicine. Patients and their families should watch out for worsening depression or thoughts of suicide. Also watch out for sudden changes in feelings such as feeling anxious, agitated, panicky, irritable, hostile, aggressive, impulsive, severely restless, overly excited and hyperactive, or not being able to sleep. If this happens, especially at the beginning of treatment or after a change in dose, call your health care professional. What side effects may I notice from receiving this medicine? Side effects that you should report to your doctor or health care professional as soon as possible: -allergic reactions like skin rash, itching or hives, swelling of the face, lips, or tongue -breathing problems -fever, chills -loss of appetite -signs and symptoms of low blood sugar such as feeling anxious, confusion, dizziness, increased hunger, unusually weak or tired, sweating, shakiness, cold, irritable, headache, blurred vision, fast heartbeat, loss of consciousness -trouble passing urine or change in the amount of urine -  unusual stomach pain or upset -vomiting Side effects that usually do not require medical attention (Report these to your doctor or health care professional if they continue or are bothersome.): -constipation -diarrhea -fatigue -headache -nausea This list may not describe all possible side effects. Call your doctor for medical advice about side effects. You may report side effects to FDA at 1-800-FDA-1088. Where should I keep my medicine? Keep out of the  reach of children. Store unopened pen in a refrigerator between 2 and 8 degrees C (36 and 46 degrees F). Do not freeze or use if the medicine has been frozen. Protect from light and excessive heat. After you first use the pen, it can be stored at room temperature between 15 and 30 degrees C (59 and 86 degrees F) or in a refrigerator. Throw away your used pen after 30 days or after the expiration date, whichever comes first. Do not store your pen with the needle attached. If the needle is left on, medicine may leak from the pen. NOTE: This sheet is a summary. It may not cover all possible information. If you have questions about this medicine, talk to your doctor, pharmacist, or health care provider.  2015, [employer] Standard. (2013-08-30 12:29:49) Liraglutide injection (Weight Management) What is this medicine? LIRAGLUTIDE (LIR a GLOO tide) is used with a reduced calorie diet and exercise to help you lose weight. This medicine may be used for other purposes; ask your health care provider or pharmacist if you have questions. COMMON BRAND NAME(S): Saxenda What should I tell my health care provider before I take this medicine? They need to know if you have any of these conditions: -endocrine tumors (MEN 2) or if someone in your family had these tumors -gallstones -high cholesterol -history of alcohol abuse problem -history of pancreatitis -kidney disease or if you are on dialysis -liver disease -previous swelling of the tongue, face, or lips with difficulty breathing, difficulty swallowing, hoarseness, or tightening of the throat -stomach problems -suicidal thoughts, plans, or attempt; a previous suicide attempt by you or a family member -thyroid cancer or if someone in your family had thyroid cancer -an unusual or allergic reaction to liraglutide, medicines, foods, dyes, or preservatives -pregnant or trying to get pregnant -breast-feeding How should I use this medicine? This medicine is  for injection under the skin of your upper leg, stomach area, or upper arm. You will be taught how to prepare and give this medicine. Use exactly as directed. Take your medicine at regular intervals. Do not take it more often than directed. It is important that you put your used needles and syringes in a special sharps container. Do not put them in a trash can. If you do not have a sharps container, call your pharmacist or healthcare provider to get one. A special MedGuide will be given to you by the pharmacist with each prescription and refill. Be sure to read this information carefully each time. Talk to your pediatrician regarding the use of this medicine in children. Special care may be needed. Overdosage: If you think you've taken too much of this medicine contact a poison control center or emergency room at once. Overdosage: If you think you have taken too much of this medicine contact a poison control center or emergency room at once. NOTE: This medicine is only for you. Do not share this medicine with others. What if I miss a dose? If you miss a dose, take it as soon as you can. If it is almost time  for your next dose, take only that dose. Do not take double or extra doses. If you miss your dose for 3 days or more, call your doctor or health care professional to talk about how to restart this medicine. What may interact with this medicine? -acetaminophen -atorvastatin -birth control pills -digoxin -griseofulvin -lisinopril This list may not describe all possible interactions. Give your health care provider a list of all the medicines, herbs, non-prescription drugs, or dietary supplements you use. Also tell them if you smoke, drink alcohol, or use illegal drugs. Some items may interact with your medicine. What should I watch for while using this medicine? Visit your doctor or health care professional for regular checks on your progress. This medicine is intended to be used in addition to a  healthy diet and appropriate exercise. The best results are achieved this way. Do not increase or in any way change your dose without consulting your doctor or health care professional. This medicine may affect blood sugar levels. If you have diabetes, check with your doctor or health care professional before you change your diet or the dose of your diabetic medicine. Patients and their families should watch out for worsening depression or thoughts of suicide. Also watch out for sudden changes in feelings such as feeling anxious, agitated, panicky, irritable, hostile, aggressive, impulsive, severely restless, overly excited and hyperactive, or not being able to sleep. If this happens, especially at the beginning of treatment or after a change in dose, call your health care professional. What side effects may I notice from receiving this medicine? Side effects that you should report to your doctor or health care professional as soon as possible: -allergic reactions like skin rash, itching or hives, swelling of the face, lips, or tongue -breathing problems -fever, chills -loss of appetite -signs and symptoms of low blood sugar such as feeling anxious, confusion, dizziness, increased hunger, unusually weak or tired, sweating, shakiness, cold, irritable, headache, blurred vision, fast heartbeat, loss of consciousness -trouble passing urine or change in the amount of urine -unusual stomach pain or upset -vomiting Side effects that usually do not require medical attention (Report these to your doctor or health care professional if they continue or are bothersome.): -constipation -diarrhea -fatigue -headache -nausea This list may not describe all possible side effects. Call your doctor for medical advice about side effects. You may report side effects to FDA at 1-800-FDA-1088. Where should I keep my medicine? Keep out of the reach of children. Store unopened pen in a refrigerator between 2 and 8  degrees C (36 and 46 degrees F). Do not freeze or use if the medicine has been frozen. Protect from light and excessive heat. After you first use the pen, it can be stored at room temperature between 15 and 30 degrees C (59 and 86 degrees F) or in a refrigerator. Throw away your used pen after 30 days or after the expiration date, whichever comes first. Do not store your pen with the needle attached. If the needle is left on, medicine may leak from the pen. NOTE: This sheet is a summary. It may not cover all possible information. If you have questions about this medicine, talk to your doctor, pharmacist, or health care provider.  2015, Elsevier/Gold Standard. (2013-08-30 12:29:49) Liraglutide injection (Weight Management) What is this medicine? LIRAGLUTIDE (LIR a GLOO tide) is used with a reduced calorie diet and exercise to help you lose weight. This medicine may be used for other purposes; ask your health care provider or  pharmacist if you have questions. COMMON BRAND NAME(S): Saxenda What should I tell my health care provider before I take this medicine? They need to know if you have any of these conditions: -endocrine tumors (MEN 2) or if someone in your family had these tumors -gallstones -high cholesterol -history of alcohol abuse problem -history of pancreatitis -kidney disease or if you are on dialysis -liver disease -previous swelling of the tongue, face, or lips with difficulty breathing, difficulty swallowing, hoarseness, or tightening of the throat -stomach problems -suicidal thoughts, plans, or attempt; a previous suicide attempt by you or a family member -thyroid cancer or if someone in your family had thyroid cancer -an unusual or allergic reaction to liraglutide, medicines, foods, dyes, or preservatives -pregnant or trying to get pregnant -breast-feeding How should I use this medicine? This medicine is for injection under the skin of your upper leg, stomach area, or upper  arm. You will be taught how to prepare and give this medicine. Use exactly as directed. Take your medicine at regular intervals. Do not take it more often than directed. It is important that you put your used needles and syringes in a special sharps container. Do not put them in a trash can. If you do not have a sharps container, call your pharmacist or healthcare provider to get one. A special MedGuide will be given to you by the pharmacist with each prescription and refill. Be sure to read this information carefully each time. Talk to your pediatrician regarding the use of this medicine in children. Special care may be needed. Overdosage: If you think you've taken too much of this medicine contact a poison control center or emergency room at once. Overdosage: If you think you have taken too much of this medicine contact a poison control center or emergency room at once. NOTE: This medicine is only for you. Do not share this medicine with others. What if I miss a dose? If you miss a dose, take it as soon as you can. If it is almost time for your next dose, take only that dose. Do not take double or extra doses. If you miss your dose for 3 days or more, call your doctor or health care professional to talk about how to restart this medicine. What may interact with this medicine? -acetaminophen -atorvastatin -birth control pills -digoxin -griseofulvin -lisinopril This list may not describe all possible interactions. Give your health care provider a list of all the medicines, herbs, non-prescription drugs, or dietary supplements you use. Also tell them if you smoke, drink alcohol, or use illegal drugs. Some items may interact with your medicine. What should I watch for while using this medicine? Visit your doctor or health care professional for regular checks on your progress. This medicine is intended to be used in addition to a healthy diet and appropriate exercise. The best results are achieved  this way. Do not increase or in any way change your dose without consulting your doctor or health care professional. This medicine may affect blood sugar levels. If you have diabetes, check with your doctor or health care professional before you change your diet or the dose of your diabetic medicine. Patients and their families should watch out for worsening depression or thoughts of suicide. Also watch out for sudden changes in feelings such as feeling anxious, agitated, panicky, irritable, hostile, aggressive, impulsive, severely restless, overly excited and hyperactive, or not being able to sleep. If this happens, especially at the beginning of treatment or after a  change in dose, call your health care professional. What side effects may I notice from receiving this medicine? Side effects that you should report to your doctor or health care professional as soon as possible: -allergic reactions like skin rash, itching or hives, swelling of the face, lips, or tongue -breathing problems -fever, chills -loss of appetite -signs and symptoms of low blood sugar such as feeling anxious, confusion, dizziness, increased hunger, unusually weak or tired, sweating, shakiness, cold, irritable, headache, blurred vision, fast heartbeat, loss of consciousness -trouble passing urine or change in the amount of urine -unusual stomach pain or upset -vomiting Side effects that usually do not require medical attention (Report these to your doctor or health care professional if they continue or are bothersome.): -constipation -diarrhea -fatigue -headache -nausea This list may not describe all possible side effects. Call your doctor for medical advice about side effects. You may report side effects to FDA at 1-800-FDA-1088. Where should I keep my medicine? Keep out of the reach of children. Store unopened pen in a refrigerator between 2 and 8 degrees C (36 and 46 degrees F). Do not freeze or use if the medicine has  been frozen. Protect from light and excessive heat. After you first use the pen, it can be stored at room temperature between 15 and 30 degrees C (59 and 86 degrees F) or in a refrigerator. Throw away your used pen after 30 days or after the expiration date, whichever comes first. Do not store your pen with the needle attached. If the needle is left on, medicine may leak from the pen. NOTE: This sheet is a summary. It may not cover all possible information. If you have questions about this medicine, talk to your doctor, pharmacist, or health care provider.  2015, Elsevier/Gold Standard. (2013-08-30 12:29:49) Lorcaserin oral tablets What is this medicine? LORCASERIN (lor ca SER in) is used to promote and maintain weight loss in obese patients. This medicine should be used with a reduced calorie diet and, if appropriate, an exercise program. This medicine may be used for other purposes; ask your health care provider or pharmacist if you have questions. COMMON BRAND NAME(S): Belviq What should I tell my health care provider before I take this medicine? They need to know if you have any of these conditions: -anatomical deformation of the penis, Peyronie's disease, or history of priapism (painful and prolonged erection) -diabetes -heart disease -history of blood diseases, like sickle cell anemia or leukemia -history of irregular heartbeat -kidney disease -liver disease -suicidal thoughts, plans, or attempt; a previous suicide attempt by you or a family member -an unusual or allergic reaction to lorcaserin, other medicines, foods, dyes, or preservatives -pregnant or trying to get pregnant -breast-feeding How should I use this medicine? Take this medicine by mouth with a glass of water. Follow the directions on the prescription label. You can take it with or without food. Take your medicine at regular intervals. Do not take it more often than directed. Do not stop taking except on your doctor's  advice. Talk to your pediatrician regarding the use of this medicine in children. Special care may be needed. Overdosage: If you think you've taken too much of this medicine contact a poison control center or emergency room at once. Overdosage: If you think you have taken too much of this medicine contact a poison control center or emergency room at once. NOTE: This medicine is only for you. Do not share this medicine with others. What if I miss a dose?  If you miss a dose, take it as soon as you can. If it is almost time for your next dose, take only that dose. Do not take double or extra doses. What may interact with this medicine? -cabergoline -certain medicines for depression, anxiety, or psychotic disturbances -certain medicines for erectile dysfunction -certain medicines for migraine headache like almotriptan, eletriptan, frovatriptan, naratriptan, rizatriptan, sumatriptan, zolmitriptan -dextromethorphan -linezolid -MAOIs like Carbex, Eldepryl, Marplan, Nardil, and Parnate -medicines for diabetes -orlistat -tramadol -St. John's Wort -stimulant medicines for attention disorders, weight loss, or to stay awake This list may not describe all possible interactions. Give your health care provider a list of all the medicines, herbs, non-prescription drugs, or dietary supplements you use. Also tell them if you smoke, drink alcohol, or use illegal drugs. Some items may interact with your medicine. What should I watch for while using this medicine? This medicine is intended to be used in addition to a healthy diet and appropriate exercise. The best results are achieved this way. Do not increase or in any way change your dose without consulting your doctor or health care professional. Your doctor should tell you to stop taking this medicine if you do not lose a certain amount of weight within the first 12 weeks of treatment. Visit your doctor or health care professional for regular checkups. Your  doctor may order blood tests or other tests to see how you are doing. Do not drive, use machinery, or do anything that needs mental alertness until you know how this medicine affects you. This medicine may affect blood sugar levels. If you have diabetes, check with your doctor or health care professional before you change your diet or the dose of your diabetic medicine. Patients and their families should watch out for worsening depression or thoughts of suicide. Also watch out for sudden changes in feelings such as feeling anxious, agitated, panicky, irritable, hostile, aggressive, impulsive, severely restless, overly excited and hyperactive, or not being able to sleep. If this happens, especially at the beginning of treatment or after a change in dose, call your health care professional. Contact you doctor or health care professional right away if the erection lasts longer than 4 hours or if it becomes painful. This may be a sign of serious problem and must be treated right away to prevent permanent damage. What side effects may I notice from receiving this medicine? Side effects that you should report to your doctor or health care professional as soon as possible: -allergic reactions like skin rash, itching or hives, swelling of the face, lips, or tongue -abnormal production of milk -breast enlargement in both males and females -breathing problems -changes in emotions or moods -changes in vision -confusion -erection lasting more than 4 hours -fast or irregular heart beat -feeling faint or lightheaded, falls -fever or chills, sore throat -hallucination, loss of contact with reality -high or low blood pressure -menstrual changes -restlessness -seizures -slow or irregular heartbeat -stiff muscles -sweating -suicidal thoughts or other mood changes -tremors -trouble walking -unusually weak or tired -vomiting Side effects that usually do not require medical attention (Report these to your  doctor or health care professional if they continue or are bothersome.): -back pain -constipation -cough -dizziness -dry mouth -low blood sugar if you have diabetes (ask your doctor or healthcare professional for a list of these symptoms) -nausea -tiredness This list may not describe all possible side effects. Call your doctor for medical advice about side effects. You may report side effects to FDA at 1-800-FDA-1088. Where  should I keep my medicine? Keep out of the reach of children. Store at room temperature between 15 and 30 degrees C (59 and 86 degrees F). Throw away any unused medicine after the expiration date. NOTE: This sheet is a summary. It may not cover all possible information. If you have questions about this medicine, talk to your doctor, pharmacist, or health care provider.  2015, Elsevier/Gold Standard. (2011-01-19 17:15:17)

## 2014-12-03 NOTE — Progress Notes (Signed)
   Subjective:    Patient ID: Vanessa Valdez, female    DOB: 02/21/1966, 49 y.o.   MRN: [id number]  HPI Impaired fasting glucose - no hypoglycemic events. No wounds or sores that are not healing well. No increased thirst or urination. Checking glucose at home. Taking medications as prescribed without any side effects. Last hemoglobin A1c about 4 months ago was 6.4. Admits hasn't been the best lately.  Went on her honeymoon.   She really wants to work on weight loss. She would really like to lose about 40 pounds. She lost about 40 lbs in the past with low carb.  Hasn't been able to exercise regularly.   Hypertension- Pt denies chest pain, SOB, dizziness, or heart palpitations.  Taking meds as directed w/o problems.  Denies medication side effects.    She would really Review of Systems     Objective:   Physical Exam  Constitutional: She is oriented to person, place, and time. She appears well-developed and well-nourished.  HENT:  Head: Normocephalic and atraumatic.  Cardiovascular: Normal rate, regular rhythm and normal heart sounds.   Pulmonary/Chest: Effort normal and breath sounds normal.  Neurological: She is alert and oriented to person, place, and time.  Skin: Skin is warm and dry.  Psychiatric: She has a normal mood and affect. Her behavior is normal.          Assessment & Plan:  IFG -  well controlled. Improved A1c is 6.2 today. Continue current regimen. On metformin and ACE inhibitor. Follow-up in 6 months.  HTN- well controlled. Continue current regimen.  Abnormal weight gain/ BMI 32 - discussed options. Recommend the smart phone application called my fitness pal to help her set goals and track calories. Also encouraged her to schedule exercise a most like an appointment so she tends to work a lot of hours per week. She might even want to start with just 2 days per week and then gradually build on that. We also discussed the potential use of weight loss medications. Discussed  pros and cons and potential side effects. I gave her some additional information to think about it. If she decide she wants to start something she can give us a call and we will send in a prescription. Not sure with covered on her insurance plan. She will then need follow-up in one month for MD blood pressure and weight check.

## 2014-12-04 LAB — BASIC METABOLIC PANEL WITH GFR
BUN: 14 mg/dL (ref 6–23)
CALCIUM: 9.1 mg/dL (ref 8.4–10.5)
CHLORIDE: 104 meq/L (ref 96–112)
CO2: 23 meq/L (ref 19–32)
CREATININE: 0.89 mg/dL (ref 0.50–1.10)
GFR, Est African American: 89 mL/min
GFR, Est Non African American: 77 mL/min
Glucose, Bld: 114 mg/dL — ABNORMAL HIGH (ref 70–99)
Potassium: 4.1 mEq/L (ref 3.5–5.3)
SODIUM: 136 meq/L (ref 135–145)

## 2014-12-04 NOTE — Progress Notes (Signed)
Quick Note:  All labs are normal. ______ 

## 2014-12-05 ENCOUNTER — Telehealth: Payer: Self-pay | Admitting: *Deleted

## 2014-12-05 MED ORDER — PHENTERMINE HCL 37.5 MG PO CAPS: 37.5000 mg | ORAL_CAPSULE | ORAL | Status: DC

## 2014-12-05 NOTE — Telephone Encounter (Signed)
Pt called and stated that she would like to start medication that she and Dr. Linford ArnoldMetheney discussed at her last ov. [facility] .Loralee PacasBarkley, Achsah Mcquade TraverLynetta

## 2014-12-05 NOTE — Telephone Encounter (Signed)
Ok, rx sent. F/U in 1 mo.

## 2014-12-05 NOTE — Telephone Encounter (Signed)
Pt informed.Saraiyah Hemminger [name]  

## 2014-12-17 ENCOUNTER — Telehealth: Payer: Self-pay | Admitting: *Deleted

## 2014-12-17 MED ORDER — PHENTERMINE-TOPIRAMATE ER 3.75-23 MG PO CP24: 1.0000 | ORAL_CAPSULE | Freq: Every day | ORAL | Status: DC

## 2014-12-17 NOTE — Telephone Encounter (Signed)
Okay, we can try Qsymia. We'll start with 3.75 mg daily for 14 days. After that we will then increase to 7.5 mg if she tolerates that well and doesn't have any problems or complications.

## 2014-12-17 NOTE — Telephone Encounter (Signed)
Pt informed.Vanessa Valdez [name]  

## 2014-12-17 NOTE — Telephone Encounter (Signed)
Pt called and stated that the phentermine is causing dizziness, insomnia. She would like to try something else.Laureen Ochs.Jovann Luse, Viann Shoveonya [name] '

## 2014-12-18 ENCOUNTER — Telehealth: Payer: Self-pay | Admitting: Family Medicine

## 2014-12-18 NOTE — Telephone Encounter (Signed)
Received fax for prior authorization on Qsymia 3.75-23mg . I called [facility] and spoke with Colin MuldersBrianna she stated that the medication was a plan exclusion and the insurance would not cover it. She also stated that they do not list any alternative medications that they will cover. - CF

## 2015-02-16 ENCOUNTER — Other Ambulatory Visit: Payer: Self-pay | Admitting: Family Medicine

## 2015-03-17 ENCOUNTER — Other Ambulatory Visit: Payer: Self-pay | Admitting: Family Medicine

## 2015-05-05 ENCOUNTER — Other Ambulatory Visit: Payer: Self-pay | Admitting: Family Medicine

## 2015-05-18 ENCOUNTER — Other Ambulatory Visit: Payer: Self-pay | Admitting: Family Medicine

## 2015-05-31 ENCOUNTER — Other Ambulatory Visit: Payer: Self-pay | Admitting: Family Medicine

## 2015-06-05 ENCOUNTER — Ambulatory Visit: Payer: 59 | Admitting: Family Medicine

## 2015-07-03 ENCOUNTER — Ambulatory Visit: Payer: 59 | Admitting: Family Medicine

## 2015-07-22 ENCOUNTER — Other Ambulatory Visit: Payer: Self-pay | Admitting: Family Medicine

## 2015-08-05 ENCOUNTER — Ambulatory Visit: Payer: 59 | Admitting: Family Medicine

## 2015-08-17 ENCOUNTER — Other Ambulatory Visit: Payer: Self-pay | Admitting: Family Medicine

## 2015-10-20 ENCOUNTER — Other Ambulatory Visit: Payer: Self-pay | Admitting: Family Medicine

## 2015-10-24 ENCOUNTER — Other Ambulatory Visit: Payer: Self-pay | Admitting: Family Medicine

## 2015-11-18 ENCOUNTER — Other Ambulatory Visit: Payer: Self-pay | Admitting: Family Medicine

## 2015-11-24 ENCOUNTER — Other Ambulatory Visit: Payer: Self-pay | Admitting: Family Medicine

## 2015-11-25 ENCOUNTER — Other Ambulatory Visit: Payer: Self-pay | Admitting: *Deleted

## 2016-01-13 ENCOUNTER — Other Ambulatory Visit: Payer: Self-pay | Admitting: Family Medicine

## 2016-01-14 ENCOUNTER — Other Ambulatory Visit: Payer: Self-pay | Admitting: Family Medicine

## 2016-01-15 ENCOUNTER — Ambulatory Visit (INDEPENDENT_AMBULATORY_CARE_PROVIDER_SITE_OTHER): Payer: 59 | Admitting: Family Medicine

## 2016-01-15 ENCOUNTER — Encounter: Payer: Self-pay | Admitting: Family Medicine

## 2016-01-15 VITALS — BP 138/84 | HR 78 | Wt 189.0 lb

## 2016-01-15 DIAGNOSIS — R635 Abnormal weight gain: Secondary | ICD-10-CM

## 2016-01-15 DIAGNOSIS — Z114 Encounter for screening for human immunodeficiency virus [HIV]: Secondary | ICD-10-CM | POA: Diagnosis not present

## 2016-01-15 DIAGNOSIS — I10 Essential (primary) hypertension: Secondary | ICD-10-CM

## 2016-01-15 DIAGNOSIS — R7301 Impaired fasting glucose: Secondary | ICD-10-CM

## 2016-01-15 LAB — POCT GLYCOSYLATED HEMOGLOBIN (HGB A1C): Hemoglobin A1C: 6.4

## 2016-01-15 MED ORDER — HYDROCHLOROTHIAZIDE 25 MG PO TABS: ORAL_TABLET | ORAL | Status: DC

## 2016-01-15 MED ORDER — LORCASERIN HCL 10 MG PO TABS: 1.0000 | ORAL_TABLET | Freq: Two times a day (BID) | ORAL | Status: DC

## 2016-01-15 MED ORDER — METOPROLOL TARTRATE 100 MG PO TABS: 100.0000 mg | ORAL_TABLET | Freq: Two times a day (BID) | ORAL | Status: DC

## 2016-01-15 MED ORDER — EPINEPHRINE 0.3 MG/0.3ML IJ SOAJ: 0.3000 mg | Freq: Once | INTRAMUSCULAR | Status: DC

## 2016-01-15 MED ORDER — LISINOPRIL 40 MG PO TABS
40.0000 mg | ORAL_TABLET | Freq: Every day | ORAL | Status: DC
Start: 2016-01-15 — End: 2016-07-04

## 2016-01-15 MED ORDER — METFORMIN HCL 500 MG PO TABS: ORAL_TABLET | ORAL | Status: DC

## 2016-01-15 NOTE — Progress Notes (Signed)
Subjective:    CC: HTN  HPI:  Hypertension- Pt denies chest pain, SOB, dizziness, or heart palpitations.  Taking meds as directed w/o problems.  Denies medication side effects.  Currently on Cipro, metoprolol and chlorothiazide.  IFG - no inc thirst or urination. She is taking her metformin once a day. She has not been exercising regularly.  Abnormal weight gain-she still frustrated with her recent weight gain. She did get a fit bit which she started using yesterday. She has not been exercising regularly. She did not tolerate phentermine or Q Simeone well. She's interested in discussing alternative medications. She even went did a consult for the [facility] weight loss or program. But feels like she wouldn't be able to stick to the strict dietary component of it.   Past medical history, Surgical history, Family history not pertinant except as noted below, Social history, Allergies, and medications have been entered into the medical record, reviewed, and corrections made.   Review of Systems: No fevers, chills, night sweats, weight loss, chest pain, or shortness of breath.   Objective:    General: Well Developed, well nourished, and in no acute distress.  Neuro: Alert and oriented x3, extra-ocular muscles intact, sensation grossly intact.  HEENT: Normocephalic, atraumatic  Skin: Warm and dry, no rashes. Cardiac: Regular rate and rhythm, no murmurs rubs or gallops, no lower extremity edema.  Respiratory: Clear to auscultation bilaterally. Not using accessory muscles, speaking in full sentences.   Impression and Recommendations:   HTN -  Well controlled. Continue current regimen. Follow up in 4 months.    IFG - A1C of 6.4 today which is up from previous of 6.2. Discussed the importance of diet and exercise and how close this is to a diagnosis of diabetes. Encouraged her to get back on track. Consider increasing metformin to twice a day. Will also increase metformin to twice a day. She said  she's taken it twice a day before years ago when she was heavier.  Abnormal weight gain- she did not do well on phentermine or to see me a. She is a stimulant component made her feel bad. She is interested in other weight loss medications. She just got a fit and plans on getting back on track with diet and exercise.

## 2016-01-15 NOTE — Patient Instructions (Signed)
If you are able to get the Belviq and start taking it and I would like to see you in 2 months.

## 2016-03-24 ENCOUNTER — Encounter: Payer: Self-pay | Admitting: Family Medicine

## 2016-03-24 ENCOUNTER — Ambulatory Visit (INDEPENDENT_AMBULATORY_CARE_PROVIDER_SITE_OTHER): Payer: 59 | Admitting: Family Medicine

## 2016-03-24 VITALS — BP 129/64 | HR 81 | Wt 167.0 lb

## 2016-03-24 DIAGNOSIS — Z23 Encounter for immunization: Secondary | ICD-10-CM | POA: Diagnosis not present

## 2016-03-24 DIAGNOSIS — R635 Abnormal weight gain: Secondary | ICD-10-CM

## 2016-03-24 DIAGNOSIS — Z6829 Body mass index (BMI) 29.0-29.9, adult: Secondary | ICD-10-CM

## 2016-03-24 MED ORDER — LORCASERIN HCL 10 MG PO TABS: 1.0000 | ORAL_TABLET | Freq: Two times a day (BID) | ORAL | 1 refills | Status: DC

## 2016-03-24 NOTE — Progress Notes (Signed)
Subjective:    CC: Weight Loss  HPI: Abnormal weight gain - Doing really well on Belviq.  Has lost 22 lbs.  She did have some nausea initially and has experienced some low back pain. She's doing fantastic. She says she has completely overhauled her diet and is primarily eating plan based diet. Though she does eat some chicken and [place] occasionally. She's been walking some for exercise but says she hasn't gotten back and the gym yet. In part because low back pain. She says it's not improved with ibuprofen or Tylenol and if she stands or walks for long periods it becomes more sore. She denies any injury or trauma. She does sit for extended periods of time at work and does use a heating pad occasionally which does seem to help.   Past medical history, Surgical history, Family history not pertinant except as noted below, Social history, Allergies, and medications have been entered into the medical record, reviewed, and corrections made.   Review of Systems: No fevers, chills, night sweats, weight loss, chest pain, or shortness of breath.   Objective:    General: Well Developed, well nourished, and in no acute distress.  Neuro: Alert and oriented x3, extra-ocular muscles intact, sensation grossly intact.  HEENT: Normocephalic, atraumatic  Skin: Warm and dry, no rashes. Cardiac: Regular rate and rhythm, no murmurs rubs or gallops, no lower extremity edema.  Respiratory: Clear to auscultation bilaterally. Not using accessory muscles, speaking in full sentences.   Impression and Recommendations:   Abnormal weight gain/BMI 29-doing fantastic on Belviq. Refilled today. Encouraged her to work on getting more consistent and regular exercise and continuing with her dietary changes. She is really doing fantastic overall. F/U in 2 mo.

## 2016-05-17 ENCOUNTER — Encounter: Payer: Self-pay | Admitting: Family Medicine

## 2016-05-17 ENCOUNTER — Ambulatory Visit (INDEPENDENT_AMBULATORY_CARE_PROVIDER_SITE_OTHER): Payer: 59 | Admitting: Family Medicine

## 2016-05-17 VITALS — BP 101/51 | HR 80 | Ht 63.0 in | Wt 159.0 lb

## 2016-05-17 DIAGNOSIS — Z6828 Body mass index (BMI) 28.0-28.9, adult: Secondary | ICD-10-CM | POA: Diagnosis not present

## 2016-05-17 DIAGNOSIS — R7301 Impaired fasting glucose: Secondary | ICD-10-CM

## 2016-05-17 DIAGNOSIS — I10 Essential (primary) hypertension: Secondary | ICD-10-CM

## 2016-05-17 DIAGNOSIS — R635 Abnormal weight gain: Secondary | ICD-10-CM | POA: Diagnosis not present

## 2016-05-17 LAB — POCT GLYCOSYLATED HEMOGLOBIN (HGB A1C): Hemoglobin A1C: 5.8

## 2016-05-17 MED ORDER — LORCASERIN HCL 10 MG PO TABS: 1.0000 | ORAL_TABLET | Freq: Two times a day (BID) | ORAL | 2 refills | Status: DC

## 2016-05-17 NOTE — Progress Notes (Signed)
Subjective:    CC:   HPI:  IFG -  No inc thirst or urination. She has ben working hard on losing weight.   Hypertension- Pt denies chest pain, SOB, dizziness, or heart palpitations.  Taking meds as directed w/o problems.  Denies medication side effects.    Abnormal weight gain. She is doing very well on the Belviq. She feels that this on a fantastic job in helping to control her appetite. She's been exercising some. Usually around 3 days a week but some weeks not at all. She still struggling to get consistent with that. She has been working on eating a healthy diet  Past medical history, Surgical history, Family history not pertinant except as noted below, Social history, Allergies, and medications have been entered into the medical record, reviewed, and corrections made.   Review of Systems: No fevers, chills, night sweats, weight loss, chest pain, or shortness of breath.   Objective:    General: Well Developed, well nourished, and in no acute distress.  Neuro: Alert and oriented x3, extra-ocular muscles intact, sensation grossly intact.  HEENT: Normocephalic, atraumatic  Skin: Warm and dry, no rashes. Cardiac: Regular rate and rhythm, no murmurs rubs or gallops, no lower extremity edema.  Respiratory: Clear to auscultation bilaterally. Not using accessory muscles, speaking in full sentences.   Impression and Recommendations:   IFG - Looks fantastic. She's made significant process in getting this down. Continue metformin for now. Recheck in 6 months. Lab Results  Component Value Date   HGBA1C 5.8 05/17/2016     HTN - Well controlled. Pressures actually low so we'll have her cut her lisinopril in half and take half a tab daily. Follow-up in 3 months. Next  Abnormal weight gain-she's been a fantastic job with her weight loss. We'll go ahead and refill the Belviq today and follow-up in 3 months. Discussed working on getting into a regular exercise routine to help really work on  pacing her metabolism so that when she does eventually come off of the Belviq she will do well.

## 2016-05-17 NOTE — Patient Instructions (Signed)
Cut the lisinopril in half. Take half a tab daily.

## 2016-05-18 ENCOUNTER — Other Ambulatory Visit: Payer: Self-pay | Admitting: *Deleted

## 2016-05-18 DIAGNOSIS — E871 Hypo-osmolality and hyponatremia: Secondary | ICD-10-CM

## 2016-05-18 LAB — COMPLETE METABOLIC PANEL WITH GFR
ALBUMIN: 4.1 g/dL (ref 3.6–5.1)
ALK PHOS: 75 U/L (ref 33–115)
ALT: 22 U/L (ref 6–29)
AST: 22 U/L (ref 10–35)
BUN: 11 mg/dL (ref 7–25)
CHLORIDE: 91 mmol/L — AB (ref 98–110)
CO2: 28 mmol/L (ref 20–31)
Calcium: 9.9 mg/dL (ref 8.6–10.2)
Creat: 0.8 mg/dL (ref 0.50–1.10)
GFR, EST NON AFRICAN AMERICAN: 87 mL/min (ref >=60)
GFR, Est African American: >89 mL/min (ref >=60)
GLUCOSE: 105 mg/dL — AB (ref 65–99)
POTASSIUM: 4.8 mmol/L (ref 3.5–5.3)
SODIUM: 130 mmol/L — AB (ref 135–146)
Total Bilirubin: 0.5 mg/dL (ref 0.2–1.2)
Total Protein: 7 g/dL (ref 6.1–8.1)

## 2016-05-18 LAB — LIPID PANEL
CHOL/HDL RATIO: 3.5 ratio (ref <=5.0)
Cholesterol: 182 mg/dL (ref 125–200)
HDL: 52 mg/dL (ref >=46)
LDL Cholesterol: 103 mg/dL (ref <130)
Triglycerides: 137 mg/dL (ref <150)
VLDL: 27 mg/dL (ref <30)

## 2016-05-18 LAB — HIV ANTIBODY (ROUTINE TESTING W REFLEX): HIV: NONREACTIVE

## 2016-05-20 ENCOUNTER — Telehealth: Payer: Self-pay | Admitting: *Deleted

## 2016-05-20 NOTE — Telephone Encounter (Signed)
PA submitted through covermymeds for Belviq (Key: [id number]) - [id number]

## 2016-05-23 ENCOUNTER — Other Ambulatory Visit: Payer: Self-pay | Admitting: Family Medicine

## 2016-05-24 NOTE — Telephone Encounter (Signed)
There is policy exclusion for [employer]

## 2016-05-24 NOTE — Telephone Encounter (Signed)
Please call patient and see what she would like to do.  Does she want a referral to bariatric clinic?

## 2016-05-25 ENCOUNTER — Encounter: Payer: Self-pay | Admitting: *Deleted

## 2016-05-25 NOTE — Telephone Encounter (Signed)
Called # listed as home/cell twice and vm states it someone named DeWittMichelle. Unable to leave message because vm was full. Not sure if this is even correct #. Letter mailed

## 2016-05-26 ENCOUNTER — Other Ambulatory Visit: Payer: Self-pay

## 2016-05-26 DIAGNOSIS — R7301 Impaired fasting glucose: Secondary | ICD-10-CM

## 2016-05-26 MED ORDER — METFORMIN HCL 500 MG PO TABS: 500.0000 mg | ORAL_TABLET | Freq: Two times a day (BID) | ORAL | 1 refills | Status: DC

## 2016-06-25 ENCOUNTER — Other Ambulatory Visit: Payer: Self-pay | Admitting: Family Medicine

## 2016-06-25 DIAGNOSIS — I10 Essential (primary) hypertension: Secondary | ICD-10-CM

## 2016-06-30 ENCOUNTER — Other Ambulatory Visit: Payer: Self-pay | Admitting: Family Medicine

## 2016-06-30 DIAGNOSIS — I10 Essential (primary) hypertension: Secondary | ICD-10-CM

## 2016-07-04 ENCOUNTER — Other Ambulatory Visit: Payer: Self-pay | Admitting: Family Medicine

## 2016-07-04 DIAGNOSIS — I10 Essential (primary) hypertension: Secondary | ICD-10-CM

## 2016-08-19 ENCOUNTER — Ambulatory Visit: Payer: 59 | Admitting: Family Medicine

## 2016-08-24 ENCOUNTER — Ambulatory Visit (INDEPENDENT_AMBULATORY_CARE_PROVIDER_SITE_OTHER): Payer: 59 | Admitting: Family Medicine

## 2016-08-24 VITALS — BP 130/77 | HR 74 | Wt 145.0 lb

## 2016-08-24 DIAGNOSIS — Z5181 Encounter for therapeutic drug level monitoring: Secondary | ICD-10-CM | POA: Diagnosis not present

## 2016-08-24 DIAGNOSIS — J111 Influenza due to unidentified influenza virus with other respiratory manifestations: Secondary | ICD-10-CM | POA: Diagnosis not present

## 2016-08-24 DIAGNOSIS — R635 Abnormal weight gain: Secondary | ICD-10-CM

## 2016-08-24 MED ORDER — LISINOPRIL-HYDROCHLOROTHIAZIDE 20-25 MG PO TABS: 1.0000 | ORAL_TABLET | Freq: Every day | ORAL | 3 refills | Status: DC

## 2016-08-24 NOTE — Progress Notes (Signed)
Subjective:    CC: weight loss medication f/u   HPI: 51 year old female comes in today to follow-up for abnormal weight gain. She is currently on the Belviq to help curb her appetite. She weighed 159 pounds in October and 4 months later she is 145 pounds. BMI is now down to 25. She actually feels like she's had good energy with her weight loss. She's been going to the gym 3 days a week. She loves doing the water aerobics exercises and on the other 4 days of the week she exercises at home. It is doing some walking stretches etc. She is really mostly working on portion control. Eating a lot of vegetables and fruits. She's not actually counting calories at this point in time. She feels that she does a good job with her water intake. She says she rarely eats meat.  Hypertension- Pt denies chest pain, SOB, dizziness, or heart palpitations.  Taking meds as directed w/o problems.  Denies medication side effects.  He had cut her lisinopril in half last office visit because her blood pressures were actually running under 100.     Past medical history, Surgical history, Family history not pertinant except as noted below, Social history, Allergies, and medications have been entered into the medical record, reviewed, and corrections made.   Review of Systems: No fevers, chills, night sweats, weight loss, chest pain, or shortness of breath.   Objective:    General: Well Developed, well nourished, and in no acute distress.  Neuro: Alert and oriented x3, extra-ocular muscles intact, sensation grossly intact.  HEENT: Normocephalic, atraumatic  Skin: Warm and dry, no rashes. Cardiac: Regular rate and rhythm, no murmurs rubs or gallops, no lower extremity edema.  Respiratory: Clear to auscultation bilaterally. Not using accessory muscles, speaking in full sentences.   Impression and Recommendations:   Abnormal weight gain - discussed the plan for going forward. She is actually getting close to a normal PMI  which is absolutely fantastic. We'll continue to Belviq for another 3 months. At that point she may actually be close to her goal weight of 135- 140 pounds. That point we can look at starting to wean her off the medication if she is doing well. We did discuss trying to increase her protein levels. Especially since she's working out. This does not need to come for meat and certainly can come from other food such as legumes and nuts but encouraged her to try to increase this and her diet.   HTN - Well controlled. Continue current regimen. Follow up in  6 months.

## 2016-10-06 ENCOUNTER — Other Ambulatory Visit: Payer: Self-pay | Admitting: Family Medicine

## 2016-10-06 DIAGNOSIS — I10 Essential (primary) hypertension: Secondary | ICD-10-CM

## 2016-10-12 ENCOUNTER — Other Ambulatory Visit: Payer: Self-pay | Admitting: Family Medicine

## 2016-10-12 DIAGNOSIS — R7301 Impaired fasting glucose: Secondary | ICD-10-CM

## 2016-10-25 ENCOUNTER — Telehealth: Payer: Self-pay | Admitting: Family Medicine

## 2016-10-25 ENCOUNTER — Ambulatory Visit (INDEPENDENT_AMBULATORY_CARE_PROVIDER_SITE_OTHER): Payer: 59 | Admitting: Family Medicine

## 2016-10-25 VITALS — BP 110/55 | HR 67 | Ht 63.0 in | Wt 138.0 lb

## 2016-10-25 DIAGNOSIS — R635 Abnormal weight gain: Secondary | ICD-10-CM

## 2016-10-25 DIAGNOSIS — Z5181 Encounter for therapeutic drug level monitoring: Secondary | ICD-10-CM | POA: Diagnosis not present

## 2016-10-25 DIAGNOSIS — I10 Essential (primary) hypertension: Secondary | ICD-10-CM

## 2016-10-25 MED ORDER — LORCASERIN HCL 10 MG PO TABS: 1.0000 | ORAL_TABLET | Freq: Two times a day (BID) | ORAL | 2 refills | Status: DC

## 2016-10-25 NOTE — Progress Notes (Signed)
   Subjective:    Patient ID: Vanessa Valdez, female    DOB: 1966/04/13, 51 y.o.   MRN: [id number]  HPI Abnormal weight gain - She is doing well overall with her diet. She said she's really been on track with that. She [place] fruits and vegetables and dry to use to minimize red meat but gets protein from other meat sources. She's also been trying to do yoga and water aerobics regularly for a while. She said last week she was able to make to aerobics classes and 1 yoga class. She tries to walk on days where it more difficult to get to the gym.  Hypertension- Pt denies chest pain, SOB, dizziness, or heart palpitations.  We had acutally dec her lisiopril when I last saw her.  Taking meds as directed w/o problems.  Denies medication side effects.      Review of Systems     Objective:   Physical Exam  Constitutional: She is oriented to person, place, and time. She appears well-developed and well-nourished.  HENT:  Head: Normocephalic and atraumatic.  Cardiovascular: Normal rate, regular rhythm and normal heart sounds.   Pulmonary/Chest: Effort normal and breath sounds normal.  Neurological: She is alert and oriented to person, place, and time.  Skin: Skin is warm and dry.  Psychiatric: She has a normal mood and affect. Her behavior is normal.       Assessment & Plan:  Abnormal weight gain - She has lost 14 more lb.  She is doing great.  No problems.  She is down to her goal weight. Will continue for 3 more months dn then plan to taper.    Hypertension - Well controlled. Continue current regimen. Follow up in  6 months.    Needs to get pap and mammo up to date on her chart.

## 2016-10-25 NOTE — Telephone Encounter (Signed)
OK 

## 2016-10-25 NOTE — Telephone Encounter (Signed)
Called patient, she said that it has been several years since she has had either done.  She is going to call Dr. Ellin Saba office today to make an appointment.

## 2016-10-25 NOTE — Telephone Encounter (Signed)
Call pt top see where she gets hger pap and mammo done. I don't have them on file.

## 2016-12-10 ENCOUNTER — Other Ambulatory Visit: Payer: Self-pay | Admitting: Family Medicine

## 2016-12-10 DIAGNOSIS — I10 Essential (primary) hypertension: Secondary | ICD-10-CM

## 2017-01-08 ENCOUNTER — Other Ambulatory Visit: Payer: Self-pay | Admitting: Family Medicine

## 2017-01-08 DIAGNOSIS — I10 Essential (primary) hypertension: Secondary | ICD-10-CM

## 2017-01-25 ENCOUNTER — Ambulatory Visit (INDEPENDENT_AMBULATORY_CARE_PROVIDER_SITE_OTHER): Payer: 59 | Admitting: Family Medicine

## 2017-01-25 VITALS — BP 117/74 | HR 70 | Ht 63.0 in | Wt 131.0 lb

## 2017-01-25 DIAGNOSIS — I10 Essential (primary) hypertension: Secondary | ICD-10-CM

## 2017-01-25 DIAGNOSIS — R635 Abnormal weight gain: Secondary | ICD-10-CM | POA: Diagnosis not present

## 2017-01-25 DIAGNOSIS — R7301 Impaired fasting glucose: Secondary | ICD-10-CM

## 2017-01-25 LAB — POCT GLYCOSYLATED HEMOGLOBIN (HGB A1C): Hemoglobin A1C: 5.1

## 2017-01-25 MED ORDER — LORCASERIN HCL 10 MG PO TABS: 1.0000 | ORAL_TABLET | Freq: Two times a day (BID) | ORAL | 1 refills | Status: DC

## 2017-01-25 MED ORDER — METOPROLOL TARTRATE 100 MG PO TABS: 50.0000 mg | ORAL_TABLET | Freq: Two times a day (BID) | ORAL | 0 refills | Status: DC

## 2017-01-25 MED ORDER — METFORMIN HCL 500 MG PO TABS: 500.0000 mg | ORAL_TABLET | Freq: Every day | ORAL | 0 refills | Status: DC

## 2017-01-25 NOTE — Progress Notes (Signed)
Subjective:    Patient ID: Vanessa Valdez, female    DOB: 1966/01/06, 51 y.o.   MRN: [id number]  HPI 51 year old female is here today to follow-up for abnormal weight gain. I last saw her about 3 months ago. At that time she was doing overall well with her recent dietary changes. She's working out with the echo and aerobics. He is currently onBelviq10 mg twice a day. She is down 7 more pounds over the last 3 months. BMI is now 23. She has been back on track with exercise. She's been starting to do a little bit more light weights for muscle toning. She continues to do her yoga and walking regularly.  Impaired fasting glucose-no increased thirst or urination. No symptoms consistent with hypoglycemia. He is currently on metformin 5 mg twice a day.  Hypertension- Pt denies chest pain, SOB, dizziness, or heart palpitations.  Taking meds as directed w/o problems.  Denies medication side effects.     Review of Systems  BP 117/74   Pulse 70   Ht 5\' 3"  (1.6 m)   Wt 131 lb (59.4 kg)   BMI 23.21 kg/m     Allergies  Allergen Reactions  . Codeine Sulfate     REACTION: makes nauseated  . Penicillins Other (See Comments)    Stomach Cramps  . Erythromycin Nausea Only  . Garlic Nausea And Vomiting and Rash    Past Medical History:  Diagnosis Date  . Allergy   . Depression   . Diabetes mellitus without complication (HCC)   . Hypertension 1997  . Impaired fasting glucose   . Insomnia   . Migraines   . Preeclampsia     No past surgical history on file.  Social History   Social History  . Marital status: Married    Spouse name: N/A  . Number of children: N/A  . Years of education: N/A   Occupational History  . Not on file.   Social History Main Topics  . Smoking status: Never Smoker  . Smokeless tobacco: Not on file  . Alcohol use Yes  . Drug use: No  . Sexual activity: Not on file   Other Topics Concern  . Not on file   Social History Narrative  . No narrative on file     Family History  Problem Relation Age of Onset  . Hypertension Mother   . Diabetes Father   . Cancer Paternal Grandmother 2       colon  . Cancer Paternal Grandfather        myleoma    Outpatient Encounter Prescriptions as of 01/25/2017  Medication Sig  . cetirizine (ZYRTEC) 10 MG tablet Take 10 mg by mouth every evening.    Marland Kitchen EPINEPHrine (EPIPEN 2-PAK) 0.3 mg/0.3 mL IJ SOAJ injection Inject 0.3 mLs (0.3 mg total) into the muscle once.  Marland Kitchen lisinopril-hydrochlorothiazide (PRINZIDE,ZESTORETIC) 20-25 MG tablet Take 1 tablet by mouth daily.  . Lorcaserin HCl (BELVIQ) 10 MG TABS Take 1 tablet by mouth 2 (two) times daily.  . metFORMIN (GLUCOPHAGE) 500 MG tablet Take 1 tablet (500 mg total) by mouth daily with breakfast.  . metoprolol tartrate (LOPRESSOR) 100 MG tablet Take 0.5 tablets (50 mg total) by mouth 2 (two) times daily.  . Probiotic CAPS Take by mouth.  . [DISCONTINUED] Lorcaserin HCl (BELVIQ) 10 MG TABS Take 1 tablet by mouth 2 (two) times daily.  . [DISCONTINUED] metFORMIN (GLUCOPHAGE) 500 MG tablet TAKE 1 TABLET(500 MG) BY MOUTH TWICE DAILY WITH A  MEAL  . [DISCONTINUED] metoprolol tartrate (LOPRESSOR) 100 MG tablet TAKE 1 TABLET(100 MG) BY MOUTH TWICE DAILY   No facility-administered encounter medications on file as of 01/25/2017.           Objective:   Physical Exam  Constitutional: She is oriented to person, place, and time. She appears well-developed and well-nourished.  HENT:  Head: Normocephalic and atraumatic.  Cardiovascular: Normal rate, regular rhythm and normal heart sounds.   Pulmonary/Chest: Effort normal and breath sounds normal.  Neurological: She is alert and oriented to person, place, and time.  Skin: Skin is warm and dry.  Psychiatric: She has a normal mood and affect. Her behavior is normal.       Assessment & Plan:  Abnormal weight gain- She is doing fantastic. BMI is now down to 23 so at this point we are looking towards just working on  maintenance. She's can finish out her current month of the week. She still has about 2 weeks left. After that she will decrease down to once a day for the next 2 months. Follow-up in 2-3 months. Keep up the exercise.  IFG -  11 A1c of 5.1 today which is absolutely fantastic. Decrease metformin down to 500 mg once a day. Recheck level in 6 months.  Hypertension-blood pressure has been on the lower side last 2 office visits will go ahead and decrease metoprolol to half a tab twice a day. Continue Mozobil HCT.

## 2017-01-25 NOTE — Patient Instructions (Addendum)
Cut the metformin down to one a day.  Cut metoprolol in half. Take half a tab twice a day.

## 2017-01-31 ENCOUNTER — Telehealth: Payer: Self-pay

## 2017-01-31 NOTE — Telephone Encounter (Signed)
PA for Belviq submitted via CoverMyMeds. Awaiting response.

## 2017-01-31 NOTE — Telephone Encounter (Signed)
There is policy exclusion for Bleviq. PA cannot be processed.

## 2017-02-25 ENCOUNTER — Other Ambulatory Visit: Payer: Self-pay | Admitting: Family Medicine

## 2017-04-13 ENCOUNTER — Other Ambulatory Visit: Payer: Self-pay | Admitting: Family Medicine

## 2017-04-13 DIAGNOSIS — R7301 Impaired fasting glucose: Secondary | ICD-10-CM

## 2017-05-05 ENCOUNTER — Ambulatory Visit: Payer: 59 | Admitting: Family Medicine

## 2017-05-14 ENCOUNTER — Other Ambulatory Visit: Payer: Self-pay | Admitting: Family Medicine

## 2017-05-14 DIAGNOSIS — I10 Essential (primary) hypertension: Secondary | ICD-10-CM

## 2017-05-14 DIAGNOSIS — R7301 Impaired fasting glucose: Secondary | ICD-10-CM

## 2017-05-25 ENCOUNTER — Other Ambulatory Visit: Payer: Self-pay | Admitting: Family Medicine

## 2017-05-25 ENCOUNTER — Ambulatory Visit (INDEPENDENT_AMBULATORY_CARE_PROVIDER_SITE_OTHER): Payer: 59 | Admitting: Family Medicine

## 2017-05-25 ENCOUNTER — Ambulatory Visit (INDEPENDENT_AMBULATORY_CARE_PROVIDER_SITE_OTHER): Payer: 59

## 2017-05-25 ENCOUNTER — Encounter: Payer: Self-pay | Admitting: Family Medicine

## 2017-05-25 VITALS — BP 125/73 | HR 72 | Ht 63.0 in | Wt 132.0 lb

## 2017-05-25 DIAGNOSIS — I10 Essential (primary) hypertension: Secondary | ICD-10-CM

## 2017-05-25 DIAGNOSIS — Z1239 Encounter for other screening for malignant neoplasm of breast: Secondary | ICD-10-CM

## 2017-05-25 DIAGNOSIS — R635 Abnormal weight gain: Secondary | ICD-10-CM | POA: Diagnosis not present

## 2017-05-25 DIAGNOSIS — M5431 Sciatica, right side: Secondary | ICD-10-CM | POA: Diagnosis not present

## 2017-05-25 DIAGNOSIS — Z1231 Encounter for screening mammogram for malignant neoplasm of breast: Secondary | ICD-10-CM

## 2017-05-25 MED ORDER — EPINEPHRINE 0.3 MG/0.3ML IJ SOAJ: 0.3000 mg | Freq: Once | INTRAMUSCULAR | 99 refills | Status: AC

## 2017-05-25 MED ORDER — PREDNISONE 20 MG PO TABS: 40.0000 mg | ORAL_TABLET | Freq: Every day | ORAL | 0 refills | Status: DC

## 2017-05-25 NOTE — Progress Notes (Signed)
Subjective:    CC: Follow-up weight and blood pressure.  HPI:  F/U abnormal weight gain -she was previously taking metformin twice a day and doing very well.  Last time when I saw her in July her BMI had come down significantly and I encouraged her to drop down to the metformin once a day for a couple more months and then discontinue.  HTN -at also recently decreased her blood pressure medications.  With her weight loss her blood pressures have been dropping and so asked her to come back in so that we could recheck her pressure on her new regimen.  She has been experiencing right sided sciatica for about a month.  She says it is going down her outer hip and then down her leg and creating a little bit of numbness on the outside of the right calf and down into her foot.  She said she experiences during pregnancy years ago and more recently has been bothering her.  She does have a very sedentary job but does try to stand throughout the day when she is able to.  She does exercise regularly as well.  No recent injuries or falls.  She has been taking an anti-inflammatory.  Past medical history, Surgical history, Family history not pertinant except as noted below, Social history, Allergies, and medications have been entered into the medical record, reviewed, and corrections made.   Review of Systems: No fevers, chills, night sweats, weight loss, chest pain, or shortness of breath.   Objective:    General: Well Developed, well nourished, and in no acute distress.  Neuro: Alert and oriented x3, extra-ocular muscles intact, sensation grossly intact.  HEENT: Normocephalic, atraumatic  Skin: Warm and dry, no rashes. Cardiac: Regular rate and rhythm, no murmurs rubs or gallops, no lower extremity edema.  Respiratory: Clear to auscultation bilaterally. Not using accessory muscles, speaking in full sentences.   Impression and Recommendations:    Abnormal weight gain -she is doing fantastic and has  maintained her weight.  Her going to go ahead and stop the metformin.  She can either wean it or to stop it completely.  HTN - Well controlled.  Instead of taking 100 mg of metoprolol daily encouraged her to split it and take half a tab twice a day since she really is on the short release.  Continue current regimen. Follow up in  6 months.   Right sided sciatica -recommend a trial of prednisone for about 5 days.  Avoid other NSAIDs during that time and okay to restart anti-inflammatory if needed.  Given a handout with exercises to do for her low back.  If she is not improving then recommendation will be for more formal physical therapy and she can call me a couple weeks if she feels she wants to move forward with that.

## 2017-06-06 ENCOUNTER — Other Ambulatory Visit: Payer: Self-pay | Admitting: Family Medicine

## 2017-06-06 DIAGNOSIS — I10 Essential (primary) hypertension: Secondary | ICD-10-CM

## 2017-06-15 ENCOUNTER — Ambulatory Visit: Payer: 59 | Admitting: Obstetrics & Gynecology

## 2017-07-05 ENCOUNTER — Other Ambulatory Visit: Payer: Self-pay | Admitting: Family Medicine

## 2017-07-05 DIAGNOSIS — I10 Essential (primary) hypertension: Secondary | ICD-10-CM

## 2017-08-03 ENCOUNTER — Encounter: Payer: Self-pay | Admitting: Obstetrics & Gynecology

## 2017-08-03 ENCOUNTER — Ambulatory Visit (INDEPENDENT_AMBULATORY_CARE_PROVIDER_SITE_OTHER): Payer: 59 | Admitting: Obstetrics & Gynecology

## 2017-08-03 VITALS — BP 143/85 | HR 80 | Ht 63.0 in | Wt 137.0 lb

## 2017-08-03 DIAGNOSIS — Z1151 Encounter for screening for human papillomavirus (HPV): Secondary | ICD-10-CM | POA: Diagnosis not present

## 2017-08-03 DIAGNOSIS — Z01419 Encounter for gynecological examination (general) (routine) without abnormal findings: Secondary | ICD-10-CM

## 2017-08-03 DIAGNOSIS — Z124 Encounter for screening for malignant neoplasm of cervix: Secondary | ICD-10-CM | POA: Diagnosis not present

## 2017-08-03 NOTE — Progress Notes (Signed)
Subjective:    Vanessa Valdez is a 52 y.o. married 57P1 (51yo daughter) female who presents for an annual exam. She is having night sweats and hot flashes. She also reports decreased libido.  The patient is sexually active. GYN screening history: last pap: was normal. The patient wears seatbelts: yes. The patient participates in regular exercise: yes. (running) Has the patient ever been transfused or tattooed?: yes. The patient reports that there is not domestic violence in her life.   Menstrual History: OB History    Gravida Para Term Preterm AB Living   2 1     1 1    SAB TAB Ectopic Multiple Live Births                  Menarche age: [age] Patient's last menstrual period was 05/17/2017 (approximate).    The following portions of the patient's history were reviewed and updated as appropriate: allergies, current medications, past family history, past medical history, past social history, past surgical history and problem list.  Review of Systems Pertinent items are noted in HPI.   Husband s/p vasectomy Married for 5 years She works for [employer] FH- breast/gyn/colon, + colon cancer in paternal GM Mammogram 11/18 Pap duel Flu vaccine 10/18  She lost 60 pounds last year with Belvique  Objective:    BP (!) 143/85   Pulse 80   Ht 5\' 3"  (1.6 m)   Wt 137 lb (62.1 kg)   LMP 05/17/2017 (Approximate)   BMI 24.27 kg/m   General Appearance:    Alert, cooperative, no distress, appears stated age  Head:    Normocephalic, without obvious abnormality, atraumatic  Eyes:    PERRL, conjunctiva/corneas clear, EOM's intact, fundi    benign, both eyes  Ears:    Normal TM's and external ear canals, both ears  Nose:   Nares normal, septum midline, mucosa normal, no drainage    or sinus tenderness  Throat:   Lips, mucosa, and tongue normal; teeth and gums normal  Neck:   Supple, symmetrical, trachea midline, no adenopathy;    thyroid:  no enlargement/tenderness/nodules; no carotid   bruit or JVD   Back:     Symmetric, no curvature, ROM normal, no CVA tenderness  Lungs:     Clear to auscultation bilaterally, respirations unlabored  Chest Wall:    No tenderness or deformity   Heart:    Regular rate and rhythm, S1 and S2 normal, no murmur, rub   or gallop  Breast Exam:    No tenderness, masses, or nipple abnormality  Abdomen:     Soft, non-tender, bowel sounds active all four quadrants,    no masses, no organomegaly  Genitalia:    Normal female without lesion, discharge or tenderness, mild atrophy, normal size and shape, anteverted, mobile, non-tender, normal adnexal exam      Extremities:   Extremities normal, atraumatic, no cyanosis or edema  Pulses:   2+ and symmetric all extremities  Skin:   Skin color, texture, turgor normal, no rashes or lesions  Lymph nodes:   Cervical, supraclavicular, and axillary nodes normal  Neurologic:   CNII-XII intact, normal strength, sensation and reflexes    throughout  .    Assessment:    Healthy female exam.   Decreased libido Hot flashes   Plan:     Thin prep Pap smear. with cotesting Rec colon screening/ Shingles vaccine Testosterone ointment 3 times per week

## 2017-08-03 NOTE — Progress Notes (Signed)
Pt c/o hot flashes. Pt unsure of last pap smear.

## 2017-08-04 ENCOUNTER — Telehealth: Payer: Self-pay

## 2017-08-04 LAB — HEPATITIS C ANTIBODY
HEP C AB: NONREACTIVE
SIGNAL TO CUT-OFF: 0.02 (ref <1.00)

## 2017-08-04 LAB — CBC
HCT: 39.7 % (ref 35.0–45.0)
Hemoglobin: 13.5 g/dL (ref 11.7–15.5)
MCH: 30.1 pg (ref 27.0–33.0)
MCHC: 34 g/dL (ref 32.0–36.0)
MCV: 88.6 fL (ref 80.0–100.0)
MPV: 10.6 fL (ref 7.5–12.5)
PLATELETS: 334 10*3/uL (ref 140–400)
RBC: 4.48 10*6/uL (ref 3.80–5.10)
RDW: 13 % (ref 11.0–15.0)
WBC: 7.1 10*3/uL (ref 3.8–10.8)

## 2017-08-04 LAB — COMPREHENSIVE METABOLIC PANEL
AG Ratio: 1.5 (calc) (ref 1.0–2.5)
ALBUMIN MSPROF: 4.7 g/dL (ref 3.6–5.1)
ALT: 25 U/L (ref 6–29)
AST: 29 U/L (ref 10–35)
Alkaline phosphatase (APISO): 79 U/L (ref 33–130)
BUN: 10 mg/dL (ref 7–25)
CHLORIDE: 92 mmol/L — AB (ref 98–110)
CO2: 21 mmol/L (ref 20–32)
CREATININE: 0.78 mg/dL (ref 0.50–1.05)
Calcium: 9.8 mg/dL (ref 8.6–10.4)
GLOBULIN: 3.1 g/dL (ref 1.9–3.7)
Glucose, Bld: 99 mg/dL (ref 65–99)
POTASSIUM: 3.5 mmol/L (ref 3.5–5.3)
SODIUM: 133 mmol/L — AB (ref 135–146)
TOTAL PROTEIN: 7.8 g/dL (ref 6.1–8.1)
Total Bilirubin: 0.4 mg/dL (ref 0.2–1.2)

## 2017-08-04 LAB — CYTOLOGY - PAP
ADEQUACY: ABSENT
Diagnosis: NEGATIVE
HPV: NOT DETECTED

## 2017-08-04 LAB — LIPID PANEL
CHOL/HDL RATIO: 1.7 (calc) (ref <5.0)
Cholesterol: 249 mg/dL — ABNORMAL HIGH (ref <200)
HDL: 143 mg/dL (ref >50)
LDL CHOLESTEROL (CALC): 92 mg/dL
NON-HDL CHOLESTEROL (CALC): 106 mg/dL (ref <130)
Triglycerides: 57 mg/dL (ref <150)

## 2017-08-04 LAB — VITAMIN D 25 HYDROXY (VIT D DEFICIENCY, FRACTURES): VIT D 25 HYDROXY: 37 ng/mL (ref 30–100)

## 2017-08-04 LAB — TSH: TSH: 2.49 mIU/L

## 2017-08-04 NOTE — Telephone Encounter (Signed)
Spoke with pt about following up with her family doctor about elevated cholesterol levels per Dr.Dove's recommendation. Pt expressed understanding and said she would see Dr.Metheney about it.

## 2017-09-24 ENCOUNTER — Other Ambulatory Visit: Payer: Self-pay | Admitting: Family Medicine

## 2017-10-25 ENCOUNTER — Ambulatory Visit: Payer: 59 | Admitting: Family Medicine

## 2017-10-26 ENCOUNTER — Other Ambulatory Visit: Payer: Self-pay | Admitting: *Deleted

## 2017-10-26 ENCOUNTER — Other Ambulatory Visit: Payer: Self-pay | Admitting: Family Medicine

## 2017-10-26 DIAGNOSIS — I10 Essential (primary) hypertension: Secondary | ICD-10-CM

## 2017-10-26 DIAGNOSIS — R7301 Impaired fasting glucose: Secondary | ICD-10-CM

## 2017-11-15 ENCOUNTER — Ambulatory Visit: Payer: 59 | Admitting: Family Medicine

## 2017-12-02 ENCOUNTER — Other Ambulatory Visit: Payer: Self-pay | Admitting: Family Medicine

## 2017-12-07 ENCOUNTER — Telehealth: Payer: Self-pay

## 2017-12-07 MED ORDER — VALACYCLOVIR HCL 1 G PO TABS: 2000.0000 mg | ORAL_TABLET | Freq: Two times a day (BID) | ORAL | 0 refills | Status: AC

## 2017-12-07 NOTE — Telephone Encounter (Signed)
Med pended, please send to her pharm in [facility].

## 2017-12-07 NOTE — Telephone Encounter (Signed)
Pt advised. RX sent to [facility].  Pt advised if that when she gets home if no improvement or worsening of SX to be seen

## 2017-12-07 NOTE — Telephone Encounter (Signed)
Pt called, she is currently at the beach at [place] and had what she believes to be a fever blister come up.   Pt reports having a fever blister before but does not think that she took any medication for it.  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  Please advise.

## 2018-01-20 ENCOUNTER — Other Ambulatory Visit: Payer: Self-pay | Admitting: Family Medicine

## 2018-01-20 DIAGNOSIS — I10 Essential (primary) hypertension: Secondary | ICD-10-CM

## 2018-05-20 ENCOUNTER — Other Ambulatory Visit: Payer: Self-pay | Admitting: Family Medicine

## 2018-05-20 DIAGNOSIS — I10 Essential (primary) hypertension: Secondary | ICD-10-CM

## 2018-06-19 ENCOUNTER — Other Ambulatory Visit: Payer: Self-pay | Admitting: Family Medicine

## 2018-06-19 DIAGNOSIS — I10 Essential (primary) hypertension: Secondary | ICD-10-CM

## 2018-07-02 ENCOUNTER — Other Ambulatory Visit: Payer: Self-pay

## 2018-07-02 ENCOUNTER — Emergency Department (INDEPENDENT_AMBULATORY_CARE_PROVIDER_SITE_OTHER)
Admission: EM | Admit: 2018-07-02 | Discharge: 2018-07-02 | Disposition: A | Discharge status: Home / Self Care | Payer: 59 | Source: Home / Self Care

## 2018-07-02 ENCOUNTER — Encounter: Payer: Self-pay | Admitting: Emergency Medicine

## 2018-07-02 DIAGNOSIS — J069 Acute upper respiratory infection, unspecified: Secondary | ICD-10-CM

## 2018-07-02 MED ORDER — AZITHROMYCIN 250 MG PO TABS: 250.0000 mg | ORAL_TABLET | Freq: Every day | ORAL | 0 refills | Status: DC

## 2018-07-02 MED ORDER — BENZONATATE 100 MG PO CAPS: 100.0000 mg | ORAL_CAPSULE | Freq: Three times a day (TID) | ORAL | 0 refills | Status: DC

## 2018-07-02 NOTE — Discharge Instructions (Signed)
  You may take 500mg acetaminophen every 4-6 hours or in combination with ibuprofen 400-600mg every 6-8 hours as needed for pain, inflammation, and fever.  Be sure to well hydrated with clear liquids and get at least 8 hours of sleep at night, preferably more while sick.   Please follow up with family medicine in 1 week if needed.   

## 2018-07-02 NOTE — ED Provider Notes (Signed)
[facility]    CSN: [id number] Arrival date & time: 07/02/18  1305     History   Chief Complaint Chief Complaint  Patient presents with  . Generalized Body Aches  . Cough  . Nasal Congestion    HPI Vanessa HausenDawn W Thoen is a 52 y.o. female.   HPI  Vanessa Valdez is a 52 y.o. female presenting to UC with c/o sore throat that started 6 days days ago, gradually worsening congestion, cough, and subjective fever and fatigue. She has taken OTC cough/cold medications w/o relief. Body aches and fatigue are most bothersome today.  Denies n/v/d. No known sick contacts.    Past Medical History:  Diagnosis Date  . Allergy   . Depression   . Diabetes mellitus without complication (HCC)   . Hypertension 1997  . Impaired fasting glucose   . Insomnia   . Migraines   . Preeclampsia   . Vaginal Pap smear, abnormal     Patient Active Problem List   Diagnosis Date Noted  . DEPRESSION, INITIAL EPISODE 07/03/2007  . IMPAIRED FASTING GLUCOSE 05/03/2007  . INSOMNIA, PERSISTENT 04/12/2007  . COMMON MIGRAINE 04/12/2007  . HYPERTENSION, BENIGN ESSENTIAL 04/12/2007    History reviewed. No pertinent surgical history.  OB History    Gravida  2   Para  1   Term      Preterm      AB  1   Living  1     SAB      TAB      Ectopic      Multiple      Live Births               Home Medications    Prior to Admission medications   Medication Sig Start Date End Date Taking? Authorizing Provider  azithromycin (ZITHROMAX) 250 MG tablet Take 1 tablet (250 mg total) by mouth daily. Take first 2 tablets together, then 1 every day until finished. 07/02/18   Lurene ShadowPhelps, Jumar Greenstreet O, PA-C  benzonatate (TESSALON) 100 MG capsule Take 1-2 capsules (100-200 mg total) by mouth every 8 (eight) hours. 07/02/18   Lurene ShadowPhelps, Dynver Clemson O, PA-C  lisinopril-hydrochlorothiazide (PRINZIDE,ZESTORETIC) 20-25 MG tablet TAKE 1 TABLET BY MOUTH DAILY 12/02/17   Agapito GamesMetheney, Catherine D, MD  metFORMIN (GLUCOPHAGE) 500 MG  tablet TAKE 1 TABLET BY MOUTH TWICE DAILY WITH A MEAL 10/26/17   Agapito GamesMetheney, Catherine D, MD  metoprolol tartrate (LOPRESSOR) 100 MG tablet Take 1 tablet (100 mg total) by mouth 2 (two) times daily. LAST REFILL. YOU MUST SCHEDULE AN APPOINTMENT 06/19/18   Agapito GamesMetheney, Catherine D, MD    Family History Family History  Problem Relation Age of Onset  . Hypertension Mother   . Diabetes Father   . Cancer Paternal Grandmother 4450       colon  . Cancer Paternal Grandfather        myleoma    Social History Social History   Tobacco Use  . Smoking status: Never Smoker  . Smokeless tobacco: Never Used  Substance Use Topics  . Alcohol use: Yes  . Drug use: No     Allergies   Codeine sulfate; Penicillins; Erythromycin; and Garlic   Review of Systems Review of Systems  Constitutional: Positive for chills, fatigue and fever.  HENT: Positive for congestion, postnasal drip, sinus pressure and sore throat. Negative for ear pain and sinus pain.   Respiratory: Positive for cough. Negative for shortness of breath and wheezing.   Musculoskeletal: Positive for  arthralgias and myalgias.  Neurological: Positive for headaches. Negative for dizziness and light-headedness.     Physical Exam Triage Vital Signs ED Triage Vitals  Enc Vitals Group     BP 07/02/18 1358 (!) 148/89     Pulse Rate 07/02/18 1358 (!) 112     Resp 07/02/18 1358 18     Temp 07/02/18 1358 (!) 97.3 F (36.3 C)     Temp Source 07/02/18 1358 Oral     SpO2 07/02/18 1358 97 %     Weight 07/02/18 1359 150 lb (68 kg)     Height 07/02/18 1359 5\' 3"  (1.6 m)     Head Circumference --      Peak Flow --      Pain Score 07/02/18 1359 2     Pain Loc --      Pain Edu? --      Excl. in GC? --    No data found.  Updated Vital Signs BP (!) 148/89 (BP Location: Right Arm)   Pulse (!) 112   Temp (!) 97.3 F (36.3 C) (Oral)   Resp 18   Ht 5\' 3"  (1.6 m)   Wt 150 lb (68 kg)   SpO2 97%   BMI 26.57 kg/m   Visual Acuity Right Eye  Distance:   Left Eye Distance:   Bilateral Distance:    Right Eye Near:   Left Eye Near:    Bilateral Near:     Physical Exam Vitals signs and nursing note reviewed.  Constitutional:      Appearance: Normal appearance. She is well-developed.  HENT:     Head: Normocephalic and atraumatic.     Right Ear: Tympanic membrane normal.     Left Ear: Tympanic membrane normal.     Nose: Nose normal.     Right Sinus: No maxillary sinus tenderness or frontal sinus tenderness.     Left Sinus: No maxillary sinus tenderness or frontal sinus tenderness.     Mouth/Throat:     Lips: Pink.     Mouth: Mucous membranes are moist.     Pharynx: Oropharynx is clear.  Neck:     Musculoskeletal: Normal range of motion and neck supple.  Cardiovascular:     Rate and Rhythm: Regular rhythm. Tachycardia present.  Pulmonary:     Effort: Pulmonary effort is normal. No respiratory distress.     Breath sounds: No stridor. Rhonchi ( faint, diffuse) present. No wheezing.  Musculoskeletal: Normal range of motion.  Skin:    General: Skin is warm and dry.  Neurological:     Mental Status: She is alert and oriented to person, place, and time.  Psychiatric:        Behavior: Behavior normal.      UC Treatments / Results  Labs (all labs ordered are listed, but only abnormal results are displayed) Labs Reviewed - No data to display  EKG None  Radiology No results found.  Procedures Procedures (including critical care time)  Medications Ordered in UC Medications - No data to display  Initial Impression / Assessment and Plan / UC Course  I have reviewed the triage vital signs and the nursing notes.  Pertinent labs & imaging results that were available during my care of the patient were reviewed by me and considered in my medical decision making (see chart for details).     Faint diffuse rhonchi and worsening fatigue with subjective fever Will cover for atypical bacteria  Final Clinical  Impressions(s) / UC Diagnoses  Final diagnoses:  Upper respiratory tract infection, unspecified type     Discharge Instructions      You may take 500mg  acetaminophen every 4-6 hours or in combination with ibuprofen 400-600mg  every 6-8 hours as needed for pain, inflammation, and fever.  Be sure to well hydrated with clear liquids and get at least 8 hours of sleep at night, preferably more while sick.   Please follow up with family medicine in 1 week if needed.     ED Prescriptions    Medication Sig Dispense Auth. Provider   benzonatate (TESSALON) 100 MG capsule Take 1-2 capsules (100-200 mg total) by mouth every 8 (eight) hours. 21 capsule Doroteo Glassman, Ashwika Freels O, PA-C   azithromycin (ZITHROMAX) 250 MG tablet Take 1 tablet (250 mg total) by mouth daily. Take first 2 tablets together, then 1 every day until finished. 6 tablet Lurene Shadow, PA-C     Controlled Substance Prescriptions [place] Controlled Substance Registry consulted? Not Applicable   Rolla Plate 07/02/18 6578

## 2018-07-02 NOTE — ED Triage Notes (Signed)
Patient reports onset of sore throat 6 days ago with progression to sore throat, congestion, cough and sense of fever without actual testing.

## 2018-07-19 ENCOUNTER — Other Ambulatory Visit: Payer: Self-pay | Admitting: Family Medicine

## 2018-07-19 DIAGNOSIS — I10 Essential (primary) hypertension: Secondary | ICD-10-CM

## 2018-08-18 ENCOUNTER — Other Ambulatory Visit: Payer: Self-pay | Admitting: Family Medicine

## 2018-08-18 DIAGNOSIS — R7301 Impaired fasting glucose: Secondary | ICD-10-CM

## 2018-09-25 ENCOUNTER — Ambulatory Visit: Payer: 59 | Admitting: Family Medicine

## 2018-10-02 ENCOUNTER — Ambulatory Visit: Payer: 59 | Admitting: Family Medicine

## 2018-10-16 ENCOUNTER — Other Ambulatory Visit: Payer: Self-pay

## 2018-10-16 ENCOUNTER — Ambulatory Visit (INDEPENDENT_AMBULATORY_CARE_PROVIDER_SITE_OTHER): Payer: 59 | Admitting: Family Medicine

## 2018-10-16 ENCOUNTER — Encounter: Payer: Self-pay | Admitting: Family Medicine

## 2018-10-16 VITALS — BP 135/79 | HR 74 | Temp 97.7°F | Ht 63.0 in | Wt 152.0 lb

## 2018-10-16 DIAGNOSIS — R635 Abnormal weight gain: Secondary | ICD-10-CM

## 2018-10-16 DIAGNOSIS — I10 Essential (primary) hypertension: Secondary | ICD-10-CM | POA: Diagnosis not present

## 2018-10-16 DIAGNOSIS — R7301 Impaired fasting glucose: Secondary | ICD-10-CM

## 2018-10-16 DIAGNOSIS — G43009 Migraine without aura, not intractable, without status migrainosus: Secondary | ICD-10-CM | POA: Diagnosis not present

## 2018-10-16 DIAGNOSIS — Z6826 Body mass index (BMI) 26.0-26.9, adult: Secondary | ICD-10-CM

## 2018-10-16 MED ORDER — LISINOPRIL-HYDROCHLOROTHIAZIDE 20-25 MG PO TABS: 1.0000 | ORAL_TABLET | Freq: Every day | ORAL | 1 refills | Status: DC

## 2018-10-16 MED ORDER — SUMATRIPTAN SUCCINATE 100 MG PO TABS: 50.0000 mg | ORAL_TABLET | ORAL | 2 refills | Status: DC | PRN

## 2018-10-16 MED ORDER — METOPROLOL TARTRATE 100 MG PO TABS: 100.0000 mg | ORAL_TABLET | Freq: Two times a day (BID) | ORAL | 1 refills | Status: DC

## 2018-10-16 MED ORDER — METFORMIN HCL 500 MG PO TABS: 500.0000 mg | ORAL_TABLET | Freq: Two times a day (BID) | ORAL | 1 refills | Status: DC

## 2018-10-16 NOTE — Progress Notes (Signed)
Pt's medications sent to [facility] in [place].Laureen Ochs, Viann Shove, CMA

## 2018-10-16 NOTE — Progress Notes (Signed)
Virtual Visit via Video Note  I connected with Vanessa Valdez on 10/16/18 at  9:30 AM EDT by a video enabled telemedicine application and verified that I am speaking with the correct person using two identifiers.   I discussed the limitations of evaluation and management by telemedicine and the availability of in person appointments. The patient expressed understanding and agreed to proceed.  Subjective:    CC: BP and glucose  HPI:  Hypertension- Pt denies chest pain, SOB, dizziness, or heart palpitations.  Taking meds as directed w/o problems.  Denies medication side effects.    Impaired fasting glucose-no increased thirst or urination. No symptoms consistent with hypoglycemia. She is taking her metformin and tolerating well. Needs refills.    Migraine HA- has had 2 migraines in the lat 2-3 months. Lasted a couple of days. One was particularly severe.  Tried Zomig made her feel SOB.  She has never tried another tryptan.    She has been struggling with her weight. She is getting frustrated.  She is up 2 more lb but is up about 15 lbs from Jan of 2019.  She is not currently on a weight loss program.    Past medical history, Surgical history, Family history not pertinent except as noted below, Social history, Allergies, and medications have been entered into the medical record, reviewed, and corrections made.   Review of Systems: No fevers, chills, night sweats, weight loss, chest pain, or shortness of breath.   Objective:    General: Speaking clearly in complete sentences without any shortness of breath.  Alert and oriented x3.  Normal judgment. No apparent acute distress.    Impression and Recommendations:    HTN- Well controlled. Continue current regimen. Follow up in  4 month.    IFG - Well controlled. Continue current regimen. Follow up in 4 months.   Migraine HA - would like to try a different tryptan.  Added Zomig to her intolerance list.    Abnormal weight gain - discussed  strategies around this.  Working on daily exercise. Can use youtube. Working on Presenter, broadcasting.  If still struggling please reach out.       I discussed the assessment and treatment plan with the patient. The patient was provided an opportunity to ask questions and all were answered. The patient agreed with the plan and demonstrated an understanding of the instructions.   The patient was advised to call back or seek an in-person evaluation if the symptoms worsen or if the condition fails to improve as anticipated.    Nani Gasser, MD

## 2019-04-15 ENCOUNTER — Other Ambulatory Visit: Payer: Self-pay | Admitting: Family Medicine

## 2019-04-15 DIAGNOSIS — R7301 Impaired fasting glucose: Secondary | ICD-10-CM

## 2019-04-15 DIAGNOSIS — I10 Essential (primary) hypertension: Secondary | ICD-10-CM

## 2019-05-15 ENCOUNTER — Other Ambulatory Visit: Payer: Self-pay | Admitting: *Deleted

## 2019-05-15 ENCOUNTER — Other Ambulatory Visit: Payer: Self-pay | Admitting: Family Medicine

## 2019-05-15 DIAGNOSIS — I10 Essential (primary) hypertension: Secondary | ICD-10-CM

## 2019-05-15 DIAGNOSIS — R7301 Impaired fasting glucose: Secondary | ICD-10-CM

## 2019-05-15 MED ORDER — METOPROLOL TARTRATE 100 MG PO TABS: 100.0000 mg | ORAL_TABLET | Freq: Two times a day (BID) | ORAL | 0 refills | Status: DC

## 2019-09-19 ENCOUNTER — Other Ambulatory Visit: Payer: Self-pay | Admitting: *Deleted

## 2019-09-19 DIAGNOSIS — I10 Essential (primary) hypertension: Secondary | ICD-10-CM

## 2019-09-19 DIAGNOSIS — R7301 Impaired fasting glucose: Secondary | ICD-10-CM

## 2019-09-19 MED ORDER — LISINOPRIL-HYDROCHLOROTHIAZIDE 20-25 MG PO TABS: 1.0000 | ORAL_TABLET | Freq: Every day | ORAL | 0 refills | Status: DC

## 2020-01-22 ENCOUNTER — Encounter: Payer: Self-pay | Admitting: Nurse Practitioner

## 2020-01-22 ENCOUNTER — Ambulatory Visit (INDEPENDENT_AMBULATORY_CARE_PROVIDER_SITE_OTHER): Payer: 59 | Admitting: Nurse Practitioner

## 2020-01-22 ENCOUNTER — Other Ambulatory Visit: Payer: Self-pay

## 2020-01-22 VITALS — BP 158/93 | HR 150 | Temp 97.9°F | Ht 63.0 in | Wt 161.5 lb

## 2020-01-22 DIAGNOSIS — I10 Essential (primary) hypertension: Secondary | ICD-10-CM

## 2020-01-22 DIAGNOSIS — R7301 Impaired fasting glucose: Secondary | ICD-10-CM

## 2020-01-22 DIAGNOSIS — R000 Tachycardia, unspecified: Secondary | ICD-10-CM

## 2020-01-22 MED ORDER — METOPROLOL TARTRATE 100 MG PO TABS: 100.0000 mg | ORAL_TABLET | Freq: Two times a day (BID) | ORAL | 0 refills | Status: DC

## 2020-01-22 MED ORDER — LISINOPRIL-HYDROCHLOROTHIAZIDE 20-25 MG PO TABS: 1.0000 | ORAL_TABLET | Freq: Every day | ORAL | 0 refills | Status: DC

## 2020-01-22 MED ORDER — METFORMIN HCL 500 MG PO TABS: 500.0000 mg | ORAL_TABLET | Freq: Two times a day (BID) | ORAL | 0 refills | Status: DC

## 2020-01-22 NOTE — Progress Notes (Signed)
Established Patient Office Visit  Subjective:  Patient ID: Vanessa Valdez, female    DOB: 14-Nov-1965  Age: 54 y.o. MRN: [id number]  CC:  Chief Complaint  Patient presents with   Hypertension    HPI Vanessa Valdez presents for follow-up for her blood pressure. She is prescribed lisinipril-hctz 20-25mg  once a day and metoprolol 100mg  twice day.   She has been out of her medication for several weeks now.  HYPERTENSION Hypertension status: exacerbated  Satisfied with current treatment? yes Duration of hypertension: chronic BP monitoring frequency:  weekly BP range: 140's/80's BP medication side effects:  no Medication compliance: poor compliance Aspirin: no Recurrent headaches: no Visual changes: no Palpitations: no Dyspnea: no Chest pain: no Lower extremity edema: no Dizzy/lightheaded: no   She is prescribed metformin daily for her elevated fasting blood glucose. She has been taking the medication daily, but reports she is almost out of medication. She does not check her blood sugars at home. She denies polydipsia, polyuria, or polyphagia. She does report weight gain in the past year.   Heart rate is elevated in the office today. She reports she normally drinks decaffeinated tea, but did have a coffee today prior to her appointment. She also reports being outside over the weekend and there is a possibility of dehydration. She denies chest pain, palpitations, shortness of breath.    Past Medical History:  Diagnosis Date   Allergy    Depression    Diabetes mellitus without complication (HCC)    Hypertension 1997   Impaired fasting glucose    Insomnia    Migraines    Preeclampsia    Vaginal Pap smear, abnormal     History reviewed. No pertinent surgical history.  Family History  Problem Relation Age of Onset   Hypertension Mother    Diabetes Father    Cancer Paternal Grandmother 58       colon   Cancer Paternal Grandfather        myleoma    Social History    Socioeconomic History   Marital status: Married    Spouse name: Not on file   Number of children: Not on file   Years of education: Not on file   Highest education level: Not on file  Occupational History   Not on file  Tobacco Use   Smoking status: Never Smoker   Smokeless tobacco: Never Used  Substance and Sexual Activity   Alcohol use: Yes   Drug use: No   Sexual activity: Yes  Other Topics Concern   Not on file  Social History Narrative   Not on file   Social Determinants of Health   Financial Resource Strain:    Difficulty of Paying Living Expenses:   Food Insecurity:    Worried About 45 in the Last Year:    Programme researcher, broadcasting/film/video in the Last Year:   Transportation Needs:    Barista (Medical):    Lack of Transportation (Non-Medical):   Physical Activity:    Days of Exercise per Week:    Minutes of Exercise per Session:   Stress:    Feeling of Stress :   Social Connections:    Frequency of Communication with Friends and Family:    Frequency of Social Gatherings with Friends and Family:    Attends Religious Services:    Active Member of Clubs or Organizations:    Attends Freight forwarder Meetings:    Marital Status:   Intimate Banker  Violence:    Fear of Current or Ex-Partner:    Emotionally Abused:    Physically Abused:    Sexually Abused:     Outpatient Medications Prior to Visit  Medication Sig Dispense Refill   lisinopril-hydrochlorothiazide (ZESTORETIC) 20-25 MG tablet Take 1 tablet by mouth daily. 30 DAY SUPPLY GIVEN.PLEASE CALL OFFICE AND SCHEDULE A FOLLOW UP APPOINTMENT. 30 tablet 0   metFORMIN (GLUCOPHAGE) 500 MG tablet Take 1 tablet (500 mg total) by mouth 2 (two) times daily with a meal. NEEDS APPT/LABS 180 tablet 0   metoprolol tartrate (LOPRESSOR) 100 MG tablet Take 1 tablet (100 mg total) by mouth 2 (two) times daily. 2 WEEK SUPPLY GIVEN.APPOINTMENT AND LABS REQUIRED FOR REFILLS 30  tablet 0   SUMAtriptan (IMITREX) 100 MG tablet Take 0.5-1 tablets (50-100 mg total) by mouth every 2 (two) hours as needed for migraine. May repeat in 2 hours if headache persists or recurs. 10 tablet 2   No facility-administered medications prior to visit.    Allergies  Allergen Reactions   Codeine Sulfate     REACTION: makes nauseated   Penicillins Other (See Comments)    Stomach Cramps   Zomig [Zolmitriptan] Other (See Comments)    Neck felt really tight.    Erythromycin Nausea Only   Garlic Nausea And Vomiting and Rash     Objective:    Physical Exam Vitals and nursing note reviewed.  Constitutional:      Appearance: She is normal weight.  HENT:     Head: Normocephalic.  Eyes:     Extraocular Movements: Extraocular movements intact.     Conjunctiva/sclera: Conjunctivae normal.     Pupils: Pupils are equal, round, and reactive to light.  Neck:     Vascular: No carotid bruit.  Cardiovascular:     Rate and Rhythm: Regular rhythm. Tachycardia present.     Pulses: Normal pulses.     Heart sounds: Normal heart sounds. No murmur heard.   Pulmonary:     Effort: Pulmonary effort is normal.     Breath sounds: Normal breath sounds.  Abdominal:     General: Abdomen is flat.     Palpations: Abdomen is soft.  Musculoskeletal:        General: Normal range of motion.     Cervical back: Normal range of motion.  Skin:    General: Skin is warm and dry.     Capillary Refill: Capillary refill takes less than 2 seconds.  Neurological:     General: No focal deficit present.     Mental Status: She is alert and oriented to person, place, and time.  Psychiatric:        Mood and Affect: Mood normal.        Behavior: Behavior normal.        Thought Content: Thought content normal.        Judgment: Judgment normal.     There were no vitals taken for this visit. Wt Readings from Last 3 Encounters:  10/16/18 152 lb (68.9 kg)  07/02/18 150 lb (68 kg)  08/03/17 137 lb (62.1  kg)     Health Maintenance Due  Topic Date Due   COVID-19 Vaccine (1) Never done    There are no preventive care reminders to display for this patient.  Lab Results  Component Value Date   TSH 2.49 08/03/2017   Lab Results  Component Value Date   WBC 7.1 08/03/2017   HGB 13.5 08/03/2017   HCT 39.7 08/03/2017  MCV 88.6 08/03/2017   PLT 334 08/03/2017   Lab Results  Component Value Date   NA 133 (L) 08/03/2017   K 3.5 08/03/2017   CO2 21 08/03/2017   GLUCOSE 99 08/03/2017   BUN 10 08/03/2017   CREATININE 0.78 08/03/2017   BILITOT 0.4 08/03/2017   ALKPHOS 75 05/17/2016   AST 29 08/03/2017   ALT 25 08/03/2017   PROT 7.8 08/03/2017   ALBUMIN 4.1 05/17/2016   CALCIUM 9.8 08/03/2017   Lab Results  Component Value Date   CHOL 249 (H) 08/03/2017   Lab Results  Component Value Date   HDL 143 08/03/2017   Lab Results  Component Value Date   LDLCALC 92 08/03/2017   Lab Results  Component Value Date   TRIG 57 08/03/2017   Lab Results  Component Value Date   CHOLHDL 1.7 08/03/2017   Lab Results  Component Value Date   HGBA1C 5.1 01/25/2017      Assessment & Plan:   1. HYPERTENSION, BENIGN ESSENTIAL Generally well controlled hypertension with exacerbation at this time due to being out of medication for several weeks. We will plan to restart lisinopril HCTZ and metoprolol today and obtain labs today- it looks like it has been quite some time since she had labs drawn. Instructions to monitor blood pressure at home and let us know of any readings greater than 130/80 or readings lower than 100/60.   PLAN: - COMPLETE METABOLIC PANEL WITH GFR - lisinopril-hydrochlorothiazide (ZESTORETIC) 20-25 MG tablet; Take 1 tablet by mouth daily.  Dispense: 90 tablet; Refill: 0 - metoprolol tartrate (LOPRESSOR) 100 MG tablet; Take 1 tablet (100 mg total) by mouth 2 (two) times daily.  Dispense: 180 tablet; Refill: 0 - HgB A1c - Lipid panel  2. Impaired fasting  glucose History of impaired fasting glucose. She has not had labs in quite some time to monitor this. She does not monitor her blood glucose at home. We will obtain labs today and refills provided on her metformin and blood pressure medications. Will make changes to plan of care based on the results of lab work.   PLAN: - lisinopril-hydrochlorothiazide (ZESTORETIC) 20-25 MG tablet; Take 1 tablet by mouth daily.  Dispense: 90 tablet; Refill: 0 - metoprolol tartrate (LOPRESSOR) 100 MG tablet; Take 1 tablet (100 mg total) by mouth 2 (two) times daily.  Dispense: 180 tablet; Refill: 0 - HgB A1c - metFORMIN (GLUCOPHAGE) 500 MG tablet; Take 1 tablet (500 mg total) by mouth 2 (two) times daily with a meal.  Dispense: 180 tablet; Refill: 0  3. Tachycardia Elevated heart rate in the office today. Discussed potential causes including stress, caffeine, and dehydration. Given her recent caffeine intake with being typically caffeine naive, this could be a contributing factor. Will plan to monitor TSH today since it has been quite some time since labs were drawn. Will make changes to the plan of care based on lab results.   PLAN: - TSH   Tollie Eth, NP

## 2020-01-22 NOTE — Patient Instructions (Addendum)
Monitor blood pressure at home and let us know if it is running >130/80 once you have restarted the medication.   Please go get your labs as soon as possible. I will let you know the results and we can make any changes at that time if needed.    Hypertension, Adult High blood pressure (hypertension) is when the force of blood pumping through the arteries is too strong. The arteries are the blood vessels that carry blood from the heart throughout the body. Hypertension forces the heart to work harder to pump blood and may cause arteries to become narrow or stiff. Untreated or uncontrolled hypertension can cause a heart attack, heart failure, a stroke, kidney disease, and other problems. A blood pressure reading consists of a higher number over a lower number. Ideally, your blood pressure should be below 120/80. The first ("top") number is called the systolic pressure. It is a measure of the pressure in your arteries as your heart beats. The second ("bottom") number is called the diastolic pressure. It is a measure of the pressure in your arteries as the heart relaxes. What are the causes? The exact cause of this condition is not known. There are some conditions that result in or are related to high blood pressure. What increases the risk? Some risk factors for high blood pressure are under your control. The following factors may make you more likely to develop this condition:  Smoking.  Having type 2 diabetes mellitus, high cholesterol, or both.  Not getting enough exercise or physical activity.  Being overweight.  Having too much fat, sugar, calories, or salt (sodium) in your diet.  Drinking too much alcohol. Some risk factors for high blood pressure may be difficult or impossible to change. Some of these factors include:  Having chronic kidney disease.  Having a family history of high blood pressure.  Age. Risk increases with age.  Race. You may be at higher risk if you are African  American.  Gender. Men are at higher risk than women before age 86. After age 49, women are at higher risk than men.  Having obstructive sleep apnea.  Stress. What are the signs or symptoms? High blood pressure may not cause symptoms. Very high blood pressure (hypertensive crisis) may cause:  Headache.  Anxiety.  Shortness of breath.  Nosebleed.  Nausea and vomiting.  Vision changes.  Severe chest pain.  Seizures. How is this diagnosed? This condition is diagnosed by measuring your blood pressure while you are seated, with your arm resting on a flat surface, your legs uncrossed, and your feet flat on the floor. The cuff of the blood pressure monitor will be placed directly against the skin of your upper arm at the level of your heart. It should be measured at least twice using the same arm. Certain conditions can cause a difference in blood pressure between your right and left arms. Certain factors can cause blood pressure readings to be lower or higher than normal for a short period of time:  When your blood pressure is higher when you are in a health care provider's office than when you are at home, this is called white coat hypertension. Most people with this condition do not need medicines.  When your blood pressure is higher at home than when you are in a health care provider's office, this is called masked hypertension. Most people with this condition may need medicines to control blood pressure. If you have a high blood pressure reading during one visit  or you have normal blood pressure with other risk factors, you may be asked to:  Return on a different day to have your blood pressure checked again.  Monitor your blood pressure at home for 1 week or longer. If you are diagnosed with hypertension, you may have other blood or imaging tests to help your health care provider understand your overall risk for other conditions. How is this treated? This condition is treated by  making healthy lifestyle changes, such as eating healthy foods, exercising more, and reducing your alcohol intake. Your health care provider may prescribe medicine if lifestyle changes are not enough to get your blood pressure under control, and if:  Your systolic blood pressure is above 130.  Your diastolic blood pressure is above 80. Your personal target blood pressure may vary depending on your medical conditions, your age, and other factors. Follow these instructions at home: Eating and drinking   Eat a diet that is high in fiber and potassium, and low in sodium, added sugar, and fat. An example eating plan is called the DASH (Dietary Approaches to Stop Hypertension) diet. To eat this way: ? Eat plenty of fresh fruits and vegetables. Try to fill one half of your plate at each meal with fruits and vegetables. ? Eat whole grains, such as whole-wheat pasta, brown rice, or whole-grain bread. Fill about one fourth of your plate with whole grains. ? Eat or drink low-fat dairy products, such as skim milk or low-fat yogurt. ? Avoid fatty cuts of meat, processed or cured meats, and poultry with skin. Fill about one fourth of your plate with lean proteins, such as fish, chicken without skin, beans, eggs, or tofu. ? Avoid pre-made and processed foods. These tend to be higher in sodium, added sugar, and fat.  Reduce your daily sodium intake. Most people with hypertension should eat less than 1,500 mg of sodium a day.  Do not drink alcohol if: ? Your health care provider tells you not to drink. ? You are pregnant, may be pregnant, or are planning to become pregnant.  If you drink alcohol: ? Limit how much you use to:  0-1 drink a day for women.  0-2 drinks a day for men. ? Be aware of how much alcohol is in your drink. In the [place] one drink equals one 12 oz bottle of beer (355 mL), one 5 oz glass of wine (148 mL), or one 1 oz glass of hard liquor (44 mL). Lifestyle   Work with your health  care provider to maintain a healthy body weight or to lose weight. Ask what an ideal weight is for you.  Get at least 30 minutes of exercise most days of the week. Activities may include walking, swimming, or biking.  Include exercise to strengthen your muscles (resistance exercise), such as Pilates or lifting weights, as part of your weekly exercise routine. Try to do these types of exercises for 30 minutes at least 3 days a week.  Do not use any products that contain nicotine or tobacco, such as cigarettes, e-cigarettes, and chewing tobacco. If you need help quitting, ask your health care provider.  Monitor your blood pressure at home as told by your health care provider.  Keep all follow-up visits as told by your health care provider. This is important. Medicines  Take over-the-counter and prescription medicines only as told by your health care provider. Follow directions carefully. Blood pressure medicines must be taken as prescribed.  Do not skip doses of blood pressure  medicine. Doing this puts you at risk for problems and can make the medicine less effective.  Ask your health care provider about side effects or reactions to medicines that you should watch for. Contact a health care provider if you:  Think you are having a reaction to a medicine you are taking.  Have headaches that keep coming back (recurring).  Feel dizzy.  Have swelling in your ankles.  Have trouble with your vision. Get help right away if you:  Develop a severe headache or confusion.  Have unusual weakness or numbness.  Feel faint.  Have severe pain in your chest or abdomen.  Vomit repeatedly.  Have trouble breathing. Summary  Hypertension is when the force of blood pumping through your arteries is too strong. If this condition is not controlled, it may put you at risk for serious complications.  Your personal target blood pressure may vary depending on your medical conditions, your age, and  other factors. For most people, a normal blood pressure is less than 120/80.  Hypertension is treated with lifestyle changes, medicines, or a combination of both. Lifestyle changes include losing weight, eating a healthy, low-sodium diet, exercising more, and limiting alcohol. This information is not intended to replace advice given to you by your health care provider. Make sure you discuss any questions you have with your health care provider. Document Revised: 03/15/2018 Document Reviewed: 03/15/2018 Elsevier Patient Education  2020 [employer]   DASH Eating Plan DASH stands for "Dietary Approaches to Stop Hypertension." The DASH eating plan is a healthy eating plan that has been shown to reduce high blood pressure (hypertension). It may also reduce your risk for type 2 diabetes, heart disease, and stroke. The DASH eating plan may also help with weight loss. What are tips for following this plan?  General guidelines  Avoid eating more than 2,300 mg (milligrams) of salt (sodium) a day. If you have hypertension, you may need to reduce your sodium intake to 1,500 mg a day.  Limit alcohol intake to no more than 1 drink a day for nonpregnant women and 2 drinks a day for men. One drink equals 12 oz of beer, 5 oz of wine, or 1 oz of hard liquor.  Work with your health care provider to maintain a healthy body weight or to lose weight. Ask what an ideal weight is for you.  Get at least 30 minutes of exercise that causes your heart to beat faster (aerobic exercise) most days of the week. Activities may include walking, swimming, or biking.  Work with your health care provider or diet and nutrition specialist (dietitian) to adjust your eating plan to your individual calorie needs. Reading food labels   Check food labels for the amount of sodium per serving. Choose foods with less than 5 percent of the Daily Value of sodium. Generally, foods with less than 300 mg of sodium per serving fit into  this eating plan.  To find whole grains, look for the word "whole" as the first word in the ingredient list. Shopping  Buy products labeled as "low-sodium" or "no salt added."  Buy fresh foods. Avoid canned foods and premade or frozen meals. Cooking  Avoid adding salt when cooking. Use salt-free seasonings or herbs instead of table salt or sea salt. Check with your health care provider or pharmacist before using salt substitutes.  Do not fry foods. Cook foods using healthy methods such as baking, boiling, grilling, and broiling instead.  Cook with heart-healthy oils, such as  olive, canola, soybean, or sunflower oil. Meal planning  Eat a balanced diet that includes: ? 5 or more servings of fruits and vegetables each day. At each meal, try to fill half of your plate with fruits and vegetables. ? Up to 6-8 servings of whole grains each day. ? Less than 6 oz of lean meat, poultry, or fish each day. A 3-oz serving of meat is about the same size as a deck of cards. One egg equals 1 oz. ? 2 servings of low-fat dairy each day. ? A serving of nuts, seeds, or beans 5 times each week. ? Heart-healthy fats. Healthy fats called Omega-3 fatty acids are found in foods such as flaxseeds and coldwater fish, like sardines, salmon, and mackerel.  Limit how much you eat of the following: ? Canned or prepackaged foods. ? Food that is high in trans fat, such as fried foods. ? Food that is high in saturated fat, such as fatty meat. ? Sweets, desserts, sugary drinks, and other foods with added sugar. ? Full-fat dairy products.  Do not salt foods before eating.  Try to eat at least 2 vegetarian meals each week.  Eat more home-cooked food and less restaurant, buffet, and fast food.  When eating at a restaurant, ask that your food be prepared with less salt or no salt, if possible. What foods are recommended? The items listed may not be a complete list. Talk with your dietitian about what dietary  choices are best for you. Grains Whole-grain or whole-wheat bread. Whole-grain or whole-wheat pasta. Brown rice. Orpah Cobb. Bulgur. Whole-grain and low-sodium cereals. Pita bread. Low-fat, low-sodium crackers. Whole-wheat flour tortillas. Vegetables Fresh or frozen vegetables (raw, steamed, roasted, or grilled). Low-sodium or reduced-sodium tomato and vegetable juice. Low-sodium or reduced-sodium tomato sauce and tomato paste. Low-sodium or reduced-sodium canned vegetables. Fruits All fresh, dried, or frozen fruit. Canned fruit in natural juice (without added sugar). Meat and other protein foods Skinless chicken or [place]. Ground chicken or [place]. Pork with fat trimmed off. Fish and seafood. Egg whites. Dried beans, peas, or lentils. Unsalted nuts, nut butters, and seeds. Unsalted canned beans. Lean cuts of beef with fat trimmed off. Low-sodium, lean deli meat. Dairy Low-fat (1%) or fat-free (skim) milk. Fat-free, low-fat, or reduced-fat cheeses. Nonfat, low-sodium ricotta or cottage cheese. Low-fat or nonfat yogurt. Low-fat, low-sodium cheese. Fats and oils Soft margarine without trans fats. Vegetable oil. Low-fat, reduced-fat, or light mayonnaise and salad dressings (reduced-sodium). Canola, safflower, olive, soybean, and sunflower oils. Avocado. Seasoning and other foods Herbs. Spices. Seasoning mixes without salt. Unsalted popcorn and pretzels. Fat-free sweets. What foods are not recommended? The items listed may not be a complete list. Talk with your dietitian about what dietary choices are best for you. Grains Baked goods made with fat, such as croissants, muffins, or some breads. Dry pasta or rice meal packs. Vegetables Creamed or fried vegetables. Vegetables in a cheese sauce. Regular canned vegetables (not low-sodium or reduced-sodium). Regular canned tomato sauce and paste (not low-sodium or reduced-sodium). Regular tomato and vegetable juice (not low-sodium or reduced-sodium).  Rosita Fire. Olives. Fruits Canned fruit in a light or heavy syrup. Fried fruit. Fruit in cream or butter sauce. Meat and other protein foods Fatty cuts of meat. Ribs. Fried meat. Tomasa Blase. Sausage. Bologna and other processed lunch meats. Salami. Fatback. Hotdogs. Bratwurst. Salted nuts and seeds. Canned beans with added salt. Canned or smoked fish. Whole eggs or egg yolks. Chicken or [place] with skin. Dairy Whole or 2% milk, cream, and half-and-half.  Whole or full-fat cream cheese. Whole-fat or sweetened yogurt. Full-fat cheese. Nondairy creamers. Whipped toppings. Processed cheese and cheese spreads. Fats and oils Butter. Stick margarine. Lard. Shortening. Ghee. Bacon fat. Tropical oils, such as coconut, palm kernel, or palm oil. Seasoning and other foods Salted popcorn and pretzels. Onion salt, garlic salt, seasoned salt, table salt, and sea salt. Worcestershire sauce. Tartar sauce. Barbecue sauce. Teriyaki sauce. Soy sauce, including reduced-sodium. Steak sauce. Canned and packaged gravies. Fish sauce. Oyster sauce. Cocktail sauce. Horseradish that you find on the shelf. Ketchup. Mustard. Meat flavorings and tenderizers. Bouillon cubes. Hot sauce and Tabasco sauce. Premade or packaged marinades. Premade or packaged taco seasonings. Relishes. Regular salad dressings. Where to find more information:  National Heart, Lung, and Blood Institute: PopSteam.is  American Heart Association: www.heart.org Summary  The DASH eating plan is a healthy eating plan that has been shown to reduce high blood pressure (hypertension). It may also reduce your risk for type 2 diabetes, heart disease, and stroke.  With the DASH eating plan, you should limit salt (sodium) intake to 2,300 mg a day. If you have hypertension, you may need to reduce your sodium intake to 1,500 mg a day.  When on the DASH eating plan, aim to eat more fresh fruits and vegetables, whole grains, lean proteins, low-fat dairy, and  heart-healthy fats.  Work with your health care provider or diet and nutrition specialist (dietitian) to adjust your eating plan to your individual calorie needs. This information is not intended to replace advice given to you by your health care provider. Make sure you discuss any questions you have with your health care provider. Document Revised: 06/17/2017 Document Reviewed: 06/28/2016 Elsevier Patient Education  2020 [employer].   Hypertension, Adult High blood pressure (hypertension) is when the force of blood pumping through the arteries is too strong. The arteries are the blood vessels that carry blood from the heart throughout the body. Hypertension forces the heart to work harder to pump blood and may cause arteries to become narrow or stiff. Untreated or uncontrolled hypertension can cause a heart attack, heart failure, a stroke, kidney disease, and other problems. A blood pressure reading consists of a higher number over a lower number. Ideally, your blood pressure should be below 120/80. The first ("top") number is called the systolic pressure. It is a measure of the pressure in your arteries as your heart beats. The second ("bottom") number is called the diastolic pressure. It is a measure of the pressure in your arteries as the heart relaxes. What are the causes? The exact cause of this condition is not known. There are some conditions that result in or are related to high blood pressure. What increases the risk? Some risk factors for high blood pressure are under your control. The following factors may make you more likely to develop this condition:  Smoking.  Having type 2 diabetes mellitus, high cholesterol, or both.  Not getting enough exercise or physical activity.  Being overweight.  Having too much fat, sugar, calories, or salt (sodium) in your diet.  Drinking too much alcohol. Some risk factors for high blood pressure may be difficult or impossible to change. Some  of these factors include:  Having chronic kidney disease.  Having a family history of high blood pressure.  Age. Risk increases with age.  Race. You may be at higher risk if you are African American.  Gender. Men are at higher risk than women before age 39. After age 75, women are at  higher risk than men.  Having obstructive sleep apnea.  Stress. What are the signs or symptoms? High blood pressure may not cause symptoms. Very high blood pressure (hypertensive crisis) may cause:  Headache.  Anxiety.  Shortness of breath.  Nosebleed.  Nausea and vomiting.  Vision changes.  Severe chest pain.  Seizures. How is this diagnosed? This condition is diagnosed by measuring your blood pressure while you are seated, with your arm resting on a flat surface, your legs uncrossed, and your feet flat on the floor. The cuff of the blood pressure monitor will be placed directly against the skin of your upper arm at the level of your heart. It should be measured at least twice using the same arm. Certain conditions can cause a difference in blood pressure between your right and left arms. Certain factors can cause blood pressure readings to be lower or higher than normal for a short period of time:  When your blood pressure is higher when you are in a health care provider's office than when you are at home, this is called white coat hypertension. Most people with this condition do not need medicines.  When your blood pressure is higher at home than when you are in a health care provider's office, this is called masked hypertension. Most people with this condition may need medicines to control blood pressure. If you have a high blood pressure reading during one visit or you have normal blood pressure with other risk factors, you may be asked to:  Return on a different day to have your blood pressure checked again.  Monitor your blood pressure at home for 1 week or longer. If you are diagnosed  with hypertension, you may have other blood or imaging tests to help your health care provider understand your overall risk for other conditions. How is this treated? This condition is treated by making healthy lifestyle changes, such as eating healthy foods, exercising more, and reducing your alcohol intake. Your health care provider may prescribe medicine if lifestyle changes are not enough to get your blood pressure under control, and if:  Your systolic blood pressure is above 130.  Your diastolic blood pressure is above 80. Your personal target blood pressure may vary depending on your medical conditions, your age, and other factors. Follow these instructions at home: Eating and drinking   Eat a diet that is high in fiber and potassium, and low in sodium, added sugar, and fat. An example eating plan is called the DASH (Dietary Approaches to Stop Hypertension) diet. To eat this way: ? Eat plenty of fresh fruits and vegetables. Try to fill one half of your plate at each meal with fruits and vegetables. ? Eat whole grains, such as whole-wheat pasta, brown rice, or whole-grain bread. Fill about one fourth of your plate with whole grains. ? Eat or drink low-fat dairy products, such as skim milk or low-fat yogurt. ? Avoid fatty cuts of meat, processed or cured meats, and poultry with skin. Fill about one fourth of your plate with lean proteins, such as fish, chicken without skin, beans, eggs, or tofu. ? Avoid pre-made and processed foods. These tend to be higher in sodium, added sugar, and fat.  Reduce your daily sodium intake. Most people with hypertension should eat less than 1,500 mg of sodium a day.  Do not drink alcohol if: ? Your health care provider tells you not to drink. ? You are pregnant, may be pregnant, or are planning to become pregnant.  If  you drink alcohol: ? Limit how much you use to:  0-1 drink a day for women.  0-2 drinks a day for men. ? Be aware of how much alcohol  is in your drink. In the [place] one drink equals one 12 oz bottle of beer (355 mL), one 5 oz glass of wine (148 mL), or one 1 oz glass of hard liquor (44 mL). Lifestyle   Work with your health care provider to maintain a healthy body weight or to lose weight. Ask what an ideal weight is for you.  Get at least 30 minutes of exercise most days of the week. Activities may include walking, swimming, or biking.  Include exercise to strengthen your muscles (resistance exercise), such as Pilates or lifting weights, as part of your weekly exercise routine. Try to do these types of exercises for 30 minutes at least 3 days a week.  Do not use any products that contain nicotine or tobacco, such as cigarettes, e-cigarettes, and chewing tobacco. If you need help quitting, ask your health care provider.  Monitor your blood pressure at home as told by your health care provider.  Keep all follow-up visits as told by your health care provider. This is important. Medicines  Take over-the-counter and prescription medicines only as told by your health care provider. Follow directions carefully. Blood pressure medicines must be taken as prescribed.  Do not skip doses of blood pressure medicine. Doing this puts you at risk for problems and can make the medicine less effective.  Ask your health care provider about side effects or reactions to medicines that you should watch for. Contact a health care provider if you:  Think you are having a reaction to a medicine you are taking.  Have headaches that keep coming back (recurring).  Feel dizzy.  Have swelling in your ankles.  Have trouble with your vision. Get help right away if you:  Develop a severe headache or confusion.  Have unusual weakness or numbness.  Feel faint.  Have severe pain in your chest or abdomen.  Vomit repeatedly.  Have trouble breathing. Summary  Hypertension is when the force of blood pumping through your arteries is too  strong. If this condition is not controlled, it may put you at risk for serious complications.  Your personal target blood pressure may vary depending on your medical conditions, your age, and other factors. For most people, a normal blood pressure is less than 120/80.  Hypertension is treated with lifestyle changes, medicines, or a combination of both. Lifestyle changes include losing weight, eating a healthy, low-sodium diet, exercising more, and limiting alcohol. This information is not intended to replace advice given to you by your health care provider. Make sure you discuss any questions you have with your health care provider. Document Revised: 03/15/2018 Document Reviewed: 03/15/2018 Elsevier Patient Education  2020 [employer].

## 2020-01-24 ENCOUNTER — Ambulatory Visit: Payer: 59 | Admitting: Family Medicine

## 2020-02-21 ENCOUNTER — Other Ambulatory Visit: Payer: Self-pay | Admitting: Nurse Practitioner

## 2020-02-21 DIAGNOSIS — R799 Abnormal finding of blood chemistry, unspecified: Secondary | ICD-10-CM

## 2020-02-21 DIAGNOSIS — R7989 Other specified abnormal findings of blood chemistry: Secondary | ICD-10-CM

## 2020-02-21 DIAGNOSIS — E871 Hypo-osmolality and hyponatremia: Secondary | ICD-10-CM

## 2020-02-21 DIAGNOSIS — R944 Abnormal results of kidney function studies: Secondary | ICD-10-CM

## 2020-02-21 NOTE — Progress Notes (Signed)
Vanessa Valdez,   Your lab results have come back and they show that your kidney function and some of your electrolytes are off. I know you said that you were possibly dehydrated from working outside when the labs were drawn. I would like you to focus on hydrating well over the next 2 weeks and then we need to recheck these labs to make sure that this was just a dehydration issue and not true loss of kidney function.   Your TSH (thyroid stimulating hormone) is elevated, which indicates that your thyroid may not be functioning correctly. I am going to add another test called T3 and T4 to get a better idea of what is going on. I am going to add this test to the blood they store in the lab, if there is a problem with using that blood I will let you know.     Your hemoglobin A1c is up to 6%, which is still in the pre-diabetic range, but higher than it has been in the past. We won't make any changes to your medication today, but I would like you to focus on a diet low in carbohydrates, sugars, and fats.   Your cholesterol is slightly elevated, but your good cholesterol (HDL) is excellent, which can make your total cholesterol high. Your bad cholesterol (LDL) is just slightly higher than we would like it. The dietary focus on low carbohydrates and saturated fats will help with this significantly.   Please let me know if you have any questions. I will place the orders for the follow-up metabolic panel in about two weeks. Please just come into the lab and have this drawn- you won't need to make another appointment. If the results have regulated, we won't need to do anything else. If the results are still off, then we may need to make some changes to your medication.   Baxter Hire- please call the lab and add  T3 and T4 testing to her thyroid. Thanks! SaraBeth

## 2020-02-25 LAB — COMPLETE METABOLIC PANEL WITH GFR
AG Ratio: 1.5 (calc) (ref 1.0–2.5)
ALT: 22 U/L (ref 6–29)
AST: 21 U/L (ref 10–35)
Albumin: 4.6 g/dL (ref 3.6–5.1)
Alkaline phosphatase (APISO): 86 U/L (ref 37–153)
BUN/Creatinine Ratio: 41 (calc) — ABNORMAL HIGH (ref 6–22)
BUN: 56 mg/dL — ABNORMAL HIGH (ref 7–25)
CO2: 22 mmol/L (ref 20–32)
Calcium: 10.6 mg/dL — ABNORMAL HIGH (ref 8.6–10.4)
Chloride: 93 mmol/L — ABNORMAL LOW (ref 98–110)
Creat: 1.35 mg/dL — ABNORMAL HIGH (ref 0.50–1.05)
GFR, Est African American: 52 mL/min/{1.73_m2} — ABNORMAL LOW (ref >=60)
GFR, Est Non African American: 45 mL/min/{1.73_m2} — ABNORMAL LOW (ref >=60)
Globulin: 3.1 g/dL (calc) (ref 1.9–3.7)
Glucose, Bld: 102 mg/dL — ABNORMAL HIGH (ref 65–99)
Potassium: 4.7 mmol/L (ref 3.5–5.3)
Sodium: 127 mmol/L — ABNORMAL LOW (ref 135–146)
Total Bilirubin: 0.3 mg/dL (ref 0.2–1.2)
Total Protein: 7.7 g/dL (ref 6.1–8.1)

## 2020-02-25 LAB — LIPID PANEL
Cholesterol: 205 mg/dL — ABNORMAL HIGH (ref <200)
HDL: 82 mg/dL (ref >=50)
LDL Cholesterol (Calc): 100 mg/dL (calc) — ABNORMAL HIGH
Non-HDL Cholesterol (Calc): 123 mg/dL (calc) (ref <130)
Total CHOL/HDL Ratio: 2.5 (calc) (ref <5.0)
Triglycerides: 135 mg/dL (ref <150)

## 2020-02-25 LAB — TSH: TSH: 5.96 mIU/L — ABNORMAL HIGH

## 2020-02-25 LAB — HEMOGLOBIN A1C
Hgb A1c MFr Bld: 6 % of total Hgb — ABNORMAL HIGH (ref <5.7)
Mean Plasma Glucose: 126 (calc)
eAG (mmol/L): 7 (calc)

## 2020-02-25 LAB — REFLEX TIQ

## 2020-02-25 LAB — T4, FREE (REFL): T4, FREE (REFL): 1.6 ng/dL (ref 0.8–1.8)

## 2020-02-25 LAB — T3: T3, Total: 112 ng/dL (ref 76–181)

## 2020-03-03 ENCOUNTER — Telehealth: Payer: Self-pay

## 2020-03-03 NOTE — Telephone Encounter (Signed)
-----   Message from Tollie Eth, NP sent at 03/03/2020  7:52 AM EDT ----- Regarding: FW: Can you call her and remind her to come for follow-up labs to check CMP this week or next, please? I've already ordered the labs. She doesn't need a visit. I will let her know how things are looking.   Thank you! SB ----- Message ----- From: Tollie Eth, NP Sent: 03/03/2020 To: Tollie Eth, NP  Remind her to come in for follow-up labs for kidney and electrolyte abnormalities on CMP. Orders already placed.

## 2020-03-03 NOTE — Telephone Encounter (Signed)
Pt aware of need to come in this week or next for repeat lab work. Pt verbalized understanding.

## 2020-03-14 ENCOUNTER — Other Ambulatory Visit: Payer: Self-pay

## 2020-03-14 DIAGNOSIS — R799 Abnormal finding of blood chemistry, unspecified: Secondary | ICD-10-CM

## 2020-03-14 DIAGNOSIS — R944 Abnormal results of kidney function studies: Secondary | ICD-10-CM

## 2020-03-14 DIAGNOSIS — E871 Hypo-osmolality and hyponatremia: Secondary | ICD-10-CM

## 2020-03-15 LAB — COMPLETE METABOLIC PANEL WITH GFR
AG Ratio: 1.9 (calc) (ref 1.0–2.5)
ALT: 24 U/L (ref 6–29)
AST: 20 U/L (ref 10–35)
Albumin: 4.6 g/dL (ref 3.6–5.1)
Alkaline phosphatase (APISO): 95 U/L (ref 37–153)
BUN: 22 mg/dL (ref 7–25)
CO2: 25 mmol/L (ref 20–32)
Calcium: 10.2 mg/dL (ref 8.6–10.4)
Chloride: 85 mmol/L — ABNORMAL LOW (ref 98–110)
Creat: 0.94 mg/dL (ref 0.50–1.05)
GFR, Est African American: 80 mL/min/{1.73_m2} (ref >=60)
GFR, Est Non African American: 69 mL/min/{1.73_m2} (ref >=60)
Globulin: 2.4 g/dL (calc) (ref 1.9–3.7)
Glucose, Bld: 105 mg/dL — ABNORMAL HIGH (ref 65–99)
Potassium: 4.3 mmol/L (ref 3.5–5.3)
Sodium: 123 mmol/L — ABNORMAL LOW (ref 135–146)
Total Bilirubin: 0.4 mg/dL (ref 0.2–1.2)
Total Protein: 7 g/dL (ref 6.1–8.1)

## 2020-03-18 ENCOUNTER — Other Ambulatory Visit: Payer: Self-pay | Admitting: Nurse Practitioner

## 2020-03-18 DIAGNOSIS — E871 Hypo-osmolality and hyponatremia: Secondary | ICD-10-CM

## 2020-03-18 MED ORDER — SODIUM CHLORIDE 1 G PO TABS: 3.0000 g | ORAL_TABLET | Freq: Three times a day (TID) | ORAL | 0 refills | Status: DC

## 2020-03-18 NOTE — Progress Notes (Signed)
Kidney function much improved, but sodium is definitely worse- likely from increased water intake.  Are you having any symptoms of headache, fatigue, lethargy, nausea, vomiting, dizziness, gait disturbances, forgetfulness, confusion, and muscle cramps?   I recommend limiting water consumption and switch to Gatorade or other electrolyte drink with sodium and potassium in it.  I also recommend taking a salt tablet to help increase the sodium.  Salt tablets can be found in the pharmacy over the counter.   I would like you to take one 3g salt tablet (sodium chloride) three times a day for the next 3 days and then we need to recheck your sodium on Friday to make sure that this is correcting.   I have sent the tablets to the pharmacy, but they may be less expensive over the counter- please ask your pharmacist.

## 2020-03-21 ENCOUNTER — Other Ambulatory Visit: Payer: Self-pay | Admitting: Nurse Practitioner

## 2020-03-21 ENCOUNTER — Telehealth: Payer: Self-pay

## 2020-03-21 DIAGNOSIS — E871 Hypo-osmolality and hyponatremia: Secondary | ICD-10-CM

## 2020-03-21 NOTE — Telephone Encounter (Signed)
Ordered CMP per lab results.

## 2020-03-22 ENCOUNTER — Encounter: Payer: Self-pay | Admitting: Nurse Practitioner

## 2020-03-22 LAB — COMPLETE METABOLIC PANEL WITH GFR
AG Ratio: 1.6 (calc) (ref 1.0–2.5)
ALT: 23 U/L (ref 6–29)
AST: 21 U/L (ref 10–35)
Albumin: 4.4 g/dL (ref 3.6–5.1)
Alkaline phosphatase (APISO): 97 U/L (ref 37–153)
BUN: 19 mg/dL (ref 7–25)
CO2: 22 mmol/L (ref 20–32)
Calcium: 10.5 mg/dL — ABNORMAL HIGH (ref 8.6–10.4)
Chloride: 99 mmol/L (ref 98–110)
Creat: 0.85 mg/dL (ref 0.50–1.05)
GFR, Est African American: 91 mL/min/{1.73_m2} (ref >=60)
GFR, Est Non African American: 78 mL/min/{1.73_m2} (ref >=60)
Globulin: 2.7 g/dL (calc) (ref 1.9–3.7)
Glucose, Bld: 102 mg/dL — ABNORMAL HIGH (ref 65–99)
Potassium: 4.6 mmol/L (ref 3.5–5.3)
Sodium: 134 mmol/L — ABNORMAL LOW (ref 135–146)
Total Bilirubin: 0.3 mg/dL (ref 0.2–1.2)
Total Protein: 7.1 g/dL (ref 6.1–8.1)

## 2020-04-16 ENCOUNTER — Other Ambulatory Visit: Payer: Self-pay | Admitting: Nurse Practitioner

## 2020-04-16 DIAGNOSIS — I10 Essential (primary) hypertension: Secondary | ICD-10-CM

## 2020-04-16 DIAGNOSIS — R7301 Impaired fasting glucose: Secondary | ICD-10-CM

## 2020-04-27 ENCOUNTER — Other Ambulatory Visit: Payer: Self-pay | Admitting: Nurse Practitioner

## 2020-04-27 DIAGNOSIS — I10 Essential (primary) hypertension: Secondary | ICD-10-CM

## 2020-04-27 DIAGNOSIS — R7301 Impaired fasting glucose: Secondary | ICD-10-CM

## 2020-07-15 ENCOUNTER — Other Ambulatory Visit: Payer: Self-pay | Admitting: Family Medicine

## 2020-07-15 DIAGNOSIS — I10 Essential (primary) hypertension: Secondary | ICD-10-CM

## 2020-07-15 DIAGNOSIS — R7301 Impaired fasting glucose: Secondary | ICD-10-CM

## 2020-07-24 ENCOUNTER — Ambulatory Visit: Payer: 59 | Admitting: Family Medicine

## 2020-08-01 ENCOUNTER — Ambulatory Visit: Payer: 59 | Admitting: Family Medicine

## 2020-08-14 ENCOUNTER — Other Ambulatory Visit: Payer: Self-pay

## 2020-08-14 ENCOUNTER — Ambulatory Visit (INDEPENDENT_AMBULATORY_CARE_PROVIDER_SITE_OTHER): Payer: 59 | Admitting: Family Medicine

## 2020-08-14 ENCOUNTER — Encounter: Payer: Self-pay | Admitting: Family Medicine

## 2020-08-14 VITALS — BP 127/72 | HR 84 | Ht 63.0 in | Wt 158.0 lb

## 2020-08-14 DIAGNOSIS — Z6827 Body mass index (BMI) 27.0-27.9, adult: Secondary | ICD-10-CM

## 2020-08-14 DIAGNOSIS — R7301 Impaired fasting glucose: Secondary | ICD-10-CM

## 2020-08-14 DIAGNOSIS — Z23 Encounter for immunization: Secondary | ICD-10-CM

## 2020-08-14 DIAGNOSIS — I10 Essential (primary) hypertension: Secondary | ICD-10-CM | POA: Diagnosis not present

## 2020-08-14 DIAGNOSIS — Z6829 Body mass index (BMI) 29.0-29.9, adult: Secondary | ICD-10-CM | POA: Insufficient documentation

## 2020-08-14 DIAGNOSIS — Z1231 Encounter for screening mammogram for malignant neoplasm of breast: Secondary | ICD-10-CM | POA: Diagnosis not present

## 2020-08-14 DIAGNOSIS — R7989 Other specified abnormal findings of blood chemistry: Secondary | ICD-10-CM

## 2020-08-14 DIAGNOSIS — Z1211 Encounter for screening for malignant neoplasm of colon: Secondary | ICD-10-CM

## 2020-08-14 LAB — POCT GLYCOSYLATED HEMOGLOBIN (HGB A1C): Hemoglobin A1C: 5.5 % (ref 4.0–5.6)

## 2020-08-14 MED ORDER — METOPROLOL TARTRATE 100 MG PO TABS: ORAL_TABLET | ORAL | 1 refills | Status: DC

## 2020-08-14 NOTE — Assessment & Plan Note (Addendum)
Dsicussed options.  She had actually done Belviq in the past and did really well with it.  She said she is really been working on her weight for several months and is just really frustrated she did lose 4 pounds which I think is fantastic and her A1c is down but she feels like she should really have lost more than that.  She is interested in restarting a weight loss medication.  So gave her the names of some current medications on the market encouraged her to check with her insurance to see if they are covered.

## 2020-08-14 NOTE — Patient Instructions (Addendum)
OK to decrease the metformin to once a day. See if you insurance will cover Saxenda, Wegovy, or Contrave for weight loss.

## 2020-08-14 NOTE — Progress Notes (Signed)
Established Patient Office Visit  Subjective:  Patient ID: Vanessa Valdez, female    DOB: [birth year]  Age: 55 y.o. MRN: [id number]  CC:  Chief Complaint  Patient presents with  . Hypertension  . ifg    HPI Aubrii W Ketter presents for   Hypertension- Pt denies chest pain, SOB, dizziness, or heart palpitations.  Taking meds as directed w/o problems.  Denies medication side effects.    Impaired fasting glucose-no increased thirst or urination. No symptoms consistent with hypoglycemia.  She does want to discuss her weight today she is just feeling really stressed frustrated with her attempts at weight loss.  She said she did up tibia and did lose a single pound she did it consistently for 4 months.  She also has a lot of stress at work.  She now manages a much larger group.  Past Medical History:  Diagnosis Date  . Allergy   . Depression   . Diabetes mellitus without complication (HCC)   . Hypertension 1997  . Impaired fasting glucose   . Insomnia   . Migraines   . Preeclampsia   . Vaginal Pap smear, abnormal     No past surgical history on file.  Family History  Problem Relation Age of Onset  . Hypertension Mother   . Diabetes Father   . Cancer Paternal Grandmother 33       colon  . Cancer Paternal Grandfather        myleoma    Social History   Socioeconomic History  . Marital status: Married    Spouse name: Not on file  . Number of children: Not on file  . Years of education: Not on file  . Highest education level: Not on file  Occupational History  . Not on file  Tobacco Use  . Smoking status: Never Smoker  . Smokeless tobacco: Never Used  Substance and Sexual Activity  . Alcohol use: Yes  . Drug use: No  . Sexual activity: Yes  Other Topics Concern  . Not on file  Social History Narrative  . Not on file   Social Determinants of Health   Financial Resource Strain: Not on file  Food Insecurity: Not on file  Transportation Needs: Not on file   Physical Activity: Not on file  Stress: Not on file  Social Connections: Not on file  Intimate Partner Violence: Not on file    Outpatient Medications Prior to Visit  Medication Sig Dispense Refill  . lisinopril-hydrochlorothiazide (ZESTORETIC) 20-25 MG tablet TAKE 1 TABLET BY MOUTH DAILY 90 tablet 0  . metFORMIN (GLUCOPHAGE) 500 MG tablet TAKE 1 TABLET(500 MG) BY MOUTH TWICE DAILY WITH A MEAL 180 tablet 0  . metoprolol tartrate (LOPRESSOR) 100 MG tablet TAKE 1 TABLET(100 MG) BY MOUTH TWICE DAILY 180 tablet 0  . sodium chloride 1 g tablet Take 3 tablets (3 g total) by mouth 3 (three) times daily. 45 tablet 0   No facility-administered medications prior to visit.    Allergies  Allergen Reactions  . Sumatriptan     Throat closing, neck swelling  . Codeine Sulfate     REACTION: makes nauseated  . Penicillins Other (See Comments)    Stomach Cramps  . Zomig [Zolmitriptan] Other (See Comments)    Neck felt really tight.   . Erythromycin Nausea Only  . Garlic Nausea And Vomiting and Rash    ROS Review of Systems    Objective:    Physical Exam  BP 127/72  Pulse 84   Ht 5\' 3"  (1.6 m)   Wt 158 lb (71.7 kg)   LMP 05/17/2017 (Approximate)   SpO2 100%   BMI 27.99 kg/m  Wt Readings from Last 3 Encounters:  08/14/20 158 lb (71.7 kg)  01/22/20 161 lb 8 oz (73.3 kg)  10/16/18 152 lb (68.9 kg)     Health Maintenance Due  Topic Date Due  . COLONOSCOPY (Pts 45-34yrs Insurance coverage will need to be confirmed)  Never done  . MAMMOGRAM  05/26/2019  . PAP SMEAR-Modifier  08/03/2020    There are no preventive care reminders to display for this patient.  Lab Results  Component Value Date   TSH 5.96 (H) 02/20/2020   Lab Results  Component Value Date   WBC 7.1 08/03/2017   HGB 13.5 08/03/2017   HCT 39.7 08/03/2017   MCV 88.6 08/03/2017   PLT 334 08/03/2017   Lab Results  Component Value Date   NA 134 (L) 03/21/2020   K 4.6 03/21/2020   CO2 22 03/21/2020    GLUCOSE 102 (H) 03/21/2020   BUN 19 03/21/2020   CREATININE 0.85 03/21/2020   BILITOT 0.3 03/21/2020   ALKPHOS 75 05/17/2016   AST 21 03/21/2020   ALT 23 03/21/2020   PROT 7.1 03/21/2020   ALBUMIN 4.1 05/17/2016   CALCIUM 10.5 (H) 03/21/2020   Lab Results  Component Value Date   CHOL 205 (H) 02/20/2020   Lab Results  Component Value Date   HDL 82 02/20/2020   Lab Results  Component Value Date   LDLCALC 100 (H) 02/20/2020   Lab Results  Component Value Date   TRIG 135 02/20/2020   Lab Results  Component Value Date   CHOLHDL 2.5 02/20/2020   Lab Results  Component Value Date   HGBA1C 5.5 08/14/2020      Assessment & Plan:   Problem List Items Addressed This Visit      Cardiovascular and Mediastinum   HYPERTENSION, BENIGN ESSENTIAL    Well controlled. Continue current regimen. Follow up in  6 mo      Relevant Medications   metoprolol tartrate (LOPRESSOR) 100 MG tablet   Other Relevant Orders   COMPLETE METABOLIC PANEL WITH GFR     Endocrine   IMPAIRED FASTING GLUCOSE - Primary    Well controlled. Continue current regimen. Follow up in  6 mo. OK to decrease the metformin to once a day.      Relevant Medications   metoprolol tartrate (LOPRESSOR) 100 MG tablet   Other Relevant Orders   POCT glycosylated hemoglobin (Hb A1C) (Completed)     Other   BMI 27.0-27.9,adult    Dsicussed options.  She had actually done Belviq in the past and did really well with it.  She said she is really been working on her weight for several months and is just really frustrated she did lose 4 pounds which I think is fantastic and her A1c is down but she feels like she should really have lost more than that.  She is interested in restarting a weight loss medication.  So gave her the names of some current medications on the market encouraged her to check with her insurance to see if they are covered.       Other Visit Diagnoses    Screening mammogram for breast cancer        Relevant Orders   MM 3D SCREEN BREAST BILATERAL   Abnormal TSH       Relevant Orders  TSH   T4, free   Need for immunization against influenza       Relevant Orders   Flu Vaccine QUAD 36+ mos IM (Completed)   Screening for malignant neoplasm of colon       Relevant Orders   Cologuard     Flu vaccine given today.    Meds ordered this encounter  Medications  . metoprolol tartrate (LOPRESSOR) 100 MG tablet    Sig: TAKE 1 TABLET(100 MG) BY MOUTH TWICE DAILY    Dispense:  180 tablet    Refill:  1    Follow-up: Return in about 6 months (around 02/11/2021).    Nani Gasser, MD

## 2020-08-14 NOTE — Assessment & Plan Note (Signed)
Well controlled. Continue current regimen. Follow up in  6 mo  

## 2020-08-14 NOTE — Assessment & Plan Note (Addendum)
Well controlled. Continue current regimen. Follow up in  6 mo. OK to decrease the metformin to once a day.

## 2020-09-19 ENCOUNTER — Telehealth: Payer: Self-pay | Admitting: Family Medicine

## 2020-09-19 NOTE — Telephone Encounter (Signed)
Left VM regarding mammogram scheduling.

## 2020-09-19 NOTE — Telephone Encounter (Signed)
Please call patient and just remind her to schedule her mammogram.

## 2020-10-13 ENCOUNTER — Other Ambulatory Visit: Payer: Self-pay | Admitting: Family Medicine

## 2020-10-13 DIAGNOSIS — R7301 Impaired fasting glucose: Secondary | ICD-10-CM

## 2020-10-13 DIAGNOSIS — I10 Essential (primary) hypertension: Secondary | ICD-10-CM

## 2021-01-02 ENCOUNTER — Telehealth (INDEPENDENT_AMBULATORY_CARE_PROVIDER_SITE_OTHER): Payer: 59 | Admitting: Physician Assistant

## 2021-01-02 VITALS — Temp 97.7°F | Ht 63.0 in | Wt 158.0 lb

## 2021-01-02 DIAGNOSIS — R197 Diarrhea, unspecified: Secondary | ICD-10-CM

## 2021-01-02 DIAGNOSIS — R110 Nausea: Secondary | ICD-10-CM

## 2021-01-02 DIAGNOSIS — R52 Pain, unspecified: Secondary | ICD-10-CM

## 2021-01-02 DIAGNOSIS — R140 Abdominal distension (gaseous): Secondary | ICD-10-CM | POA: Diagnosis not present

## 2021-01-02 DIAGNOSIS — W19XXXA Unspecified fall, initial encounter: Secondary | ICD-10-CM | POA: Insufficient documentation

## 2021-01-02 DIAGNOSIS — M549 Dorsalgia, unspecified: Secondary | ICD-10-CM

## 2021-01-02 DIAGNOSIS — R1013 Epigastric pain: Secondary | ICD-10-CM

## 2021-01-02 MED ORDER — METHOCARBAMOL 500 MG PO TABS: 500.0000 mg | ORAL_TABLET | Freq: Three times a day (TID) | ORAL | 0 refills | Status: DC

## 2021-01-02 MED ORDER — ONDANSETRON HCL 8 MG PO TABS
8.0000 mg | ORAL_TABLET | Freq: Three times a day (TID) | ORAL | 0 refills | Status: DC | PRN
Start: 2021-01-02 — End: 2021-01-29

## 2021-01-02 NOTE — Progress Notes (Signed)
Started 2 weeks ago Fever 101.5  1 night of vomiting Diarrhea every day  Bloated  Abdominal cramping Worse when she eats anything "fatty"   Early Monday AM  Tripped over bag in the dark, twisted her back Having pain in middle back, pain doesn't travel  Donald Pas, heating pad - both help some

## 2021-01-02 NOTE — Progress Notes (Signed)
Patient ID: Vanessa Valdez, female   DOB: 01-22-1966, 55 y.o.   MRN: [id number] .Marland KitchenVirtual Visit via Video Note  I connected with Vanessa Valdez on 01/02/21 at  8:50 AM EDT by a video enabled telemedicine application and verified that I am speaking with the correct person using two identifiers.  Location: Patient: work Provider: clinic  .Marland KitchenParticipating in visit:  Patient: Vanessa Valdez Provider:Sergio Hobart PA-C   I discussed the limitations of evaluation and management by telemedicine and the availability of in person appointments. The patient expressed understanding and agreed to proceed.  History of Present Illness: Patient is a 55 year old female who calls into the clinic with epigastric pain, nausea, diarrhea, bloating for the last 2 weeks.  Initially she had a fever 1-1.5.  She had 1 night where she vomited.  That all has resolved but she is left with abdominal cramping.  It is seem to make it worse with eating anything that is fatty.  She continues to have diarrhea although it is a little better.  She has no other sick contacts.  She did not test for COVID.  She has no upper respiratory symptoms.  Also last week she fell forward and initially felt back pain.  Her back pain is mid to right.  It is fairly persistent.  She is trying to avoid NSAIDs because of her GI upset.  She is using icy hot patches with little relief.  She denies any numbness and tingling radiating down back.  It is worse with movement and twisting.    .. Active Ambulatory Problems    Diagnosis Date Noted   INSOMNIA, PERSISTENT 04/12/2007   DEPRESSION, INITIAL EPISODE 07/03/2007   Migraine without aura 04/12/2007   HYPERTENSION, BENIGN ESSENTIAL 04/12/2007   IMPAIRED FASTING GLUCOSE 05/03/2007   BMI 27.0-27.9,adult 08/14/2020   Mid back pain on right side 01/02/2021   Fall 01/02/2021   Resolved Ambulatory Problems    Diagnosis Date Noted   OBESITY, CLASS I 05/29/2010   INFLUENZA DUE TO ID NOVEL H1N1 INFLUENZA VIRUS  05/06/2008   Obesity (BMI 30.0-34.9) 12/03/2014   Needs flu shot 03/24/2016   Influenza 08/24/2016   Past Medical History:  Diagnosis Date   Allergy    Depression    Diabetes mellitus without complication (HCC)    Hypertension 1997   Insomnia    Migraines    Preeclampsia    Vaginal Pap smear, abnormal       Observations/Objective: No acute distress Normal breathing  .Marland Kitchen Today's Vitals   01/02/21 0828  Temp: 97.7 F (36.5 C)  TempSrc: Oral  Weight: 158 lb (71.7 kg)  Height: 5\' 3"  (1.6 m)   Body mass index is 27.99 kg/m.    Assessment and Plan: Marland KitchenMarland KitchenDawn was seen today for diarrhea, vomiting and back pain.  Diagnoses and all orders for this visit:  Epigastric pain -     CBC with Differential/Platelet -     COMPLETE METABOLIC PANEL WITH GFR -     Lipase -     US Abdomen Complete; Future  Bloating -     CBC with Differential/Platelet -     COMPLETE METABOLIC PANEL WITH GFR -     Lipase -     US Abdomen Complete; Future  Diarrhea, unspecified type -     CBC with Differential/Platelet -     COMPLETE METABOLIC PANEL WITH GFR -     Lipase -     US Abdomen Complete; Future  Nausea -  CBC with Differential/Platelet -     COMPLETE METABOLIC PANEL WITH GFR -     Lipase -     US Abdomen Complete; Future -     ondansetron (ZOFRAN) 8 MG tablet; Take 1 tablet (8 mg total) by mouth every 8 (eight) hours as needed for nausea or vomiting.  Pain aggravated by eating or drinking -     CBC with Differential/Platelet -     COMPLETE METABOLIC PANEL WITH GFR -     Lipase -     US Abdomen Complete; Future  Mid back pain on right side -     methocarbamol (ROBAXIN) 500 MG tablet; Take 1 tablet (500 mg total) by mouth 3 (three) times daily.  Fall, initial encounter -     methocarbamol (ROBAXIN) 500 MG tablet; Take 1 tablet (500 mg total) by mouth 3 (three) times daily.  Unclear etiology of GI symptoms ddx cholecystitis vs gastroenteritis.  Will get labs and ultrasound  of abdomen.  Given zofran for nausea.  Discussed BRAT diet.  ? Covid but no URI symptoms and no other sick contacts. Did not test.  Follow up as needed or if symptoms worsen.   Fall and likely muscle spasm/strain.  Robaxin given to use as needed.  Heat and ice.  Consider tens unit and massage.  No red flags.  If worsening will consider xray. Could be related to the epigastric pain.     Follow Up Instructions:    I discussed the assessment and treatment plan with the patient. The patient was provided an opportunity to ask questions and all were answered. The patient agreed with the plan and demonstrated an understanding of the instructions.   The patient was advised to call back or seek an in-person evaluation if the symptoms worsen or if the condition fails to improve as anticipated.    Tandy Gaw, PA-C

## 2021-01-05 ENCOUNTER — Ambulatory Visit (INDEPENDENT_AMBULATORY_CARE_PROVIDER_SITE_OTHER): Payer: 59

## 2021-01-05 ENCOUNTER — Other Ambulatory Visit: Payer: Self-pay

## 2021-01-05 DIAGNOSIS — R110 Nausea: Secondary | ICD-10-CM | POA: Diagnosis not present

## 2021-01-05 DIAGNOSIS — R197 Diarrhea, unspecified: Secondary | ICD-10-CM | POA: Diagnosis not present

## 2021-01-05 DIAGNOSIS — R1013 Epigastric pain: Secondary | ICD-10-CM

## 2021-01-05 DIAGNOSIS — R140 Abdominal distension (gaseous): Secondary | ICD-10-CM

## 2021-01-05 DIAGNOSIS — R52 Pain, unspecified: Secondary | ICD-10-CM

## 2021-01-05 LAB — CBC WITH DIFFERENTIAL/PLATELET
Absolute Monocytes: 553 cells/uL (ref 200–950)
Basophils Absolute: 63 cells/uL (ref 0–200)
Basophils Relative: 0.8 %
Eosinophils Absolute: 198 cells/uL (ref 15–500)
Eosinophils Relative: 2.5 %
HCT: 36.1 % (ref 35.0–45.0)
Hemoglobin: 12.1 g/dL (ref 11.7–15.5)
Lymphs Abs: 1430 cells/uL (ref 850–3900)
MCH: 30.1 pg (ref 27.0–33.0)
MCHC: 33.5 g/dL (ref 32.0–36.0)
MCV: 89.8 fL (ref 80.0–100.0)
MPV: 11.5 fL (ref 7.5–12.5)
Monocytes Relative: 7 %
Neutro Abs: 5656 cells/uL (ref 1500–7800)
Neutrophils Relative %: 71.6 %
Platelets: 325 10*3/uL (ref 140–400)
RBC: 4.02 10*6/uL (ref 3.80–5.10)
RDW: 12.8 % (ref 11.0–15.0)
Total Lymphocyte: 18.1 %
WBC: 7.9 10*3/uL (ref 3.8–10.8)

## 2021-01-05 LAB — COMPLETE METABOLIC PANEL WITH GFR
AG Ratio: 1.4 (calc) (ref 1.0–2.5)
ALT: 31 U/L — ABNORMAL HIGH (ref 6–29)
AST: 27 U/L (ref 10–35)
Albumin: 4.2 g/dL (ref 3.6–5.1)
Alkaline phosphatase (APISO): 103 U/L (ref 37–153)
BUN/Creatinine Ratio: 13 (calc) (ref 6–22)
BUN: 19 mg/dL (ref 7–25)
CO2: 20 mmol/L (ref 20–32)
Calcium: 9.8 mg/dL (ref 8.6–10.4)
Chloride: 89 mmol/L — ABNORMAL LOW (ref 98–110)
Creat: 1.5 mg/dL — ABNORMAL HIGH (ref 0.50–1.05)
GFR, Est African American: 45 mL/min/{1.73_m2} — ABNORMAL LOW (ref >=60)
GFR, Est Non African American: 39 mL/min/{1.73_m2} — ABNORMAL LOW (ref >=60)
Globulin: 3.1 g/dL (calc) (ref 1.9–3.7)
Glucose, Bld: 128 mg/dL — ABNORMAL HIGH (ref 65–99)
Potassium: 4 mmol/L (ref 3.5–5.3)
Sodium: 121 mmol/L — ABNORMAL LOW (ref 135–146)
Total Bilirubin: 0.7 mg/dL (ref 0.2–1.2)
Total Protein: 7.3 g/dL (ref 6.1–8.1)

## 2021-01-05 LAB — LIPASE: Lipase: 241 U/L — ABNORMAL HIGH (ref 7–60)

## 2021-01-06 ENCOUNTER — Encounter: Payer: Self-pay | Admitting: Physician Assistant

## 2021-01-06 ENCOUNTER — Other Ambulatory Visit: Payer: Self-pay

## 2021-01-06 ENCOUNTER — Emergency Department (HOSPITAL_BASED_OUTPATIENT_CLINIC_OR_DEPARTMENT_OTHER): Payer: 59

## 2021-01-06 ENCOUNTER — Encounter (HOSPITAL_BASED_OUTPATIENT_CLINIC_OR_DEPARTMENT_OTHER): Payer: Self-pay | Admitting: Emergency Medicine

## 2021-01-06 ENCOUNTER — Emergency Department (HOSPITAL_BASED_OUTPATIENT_CLINIC_OR_DEPARTMENT_OTHER)
Admission: EM | Admit: 2021-01-06 | Discharge: 2021-01-06 | Disposition: A | Discharge status: Home / Self Care | Payer: 59 | Attending: Emergency Medicine | Admitting: Emergency Medicine

## 2021-01-06 DIAGNOSIS — Z79899 Other long term (current) drug therapy: Secondary | ICD-10-CM | POA: Diagnosis not present

## 2021-01-06 DIAGNOSIS — N179 Acute kidney failure, unspecified: Secondary | ICD-10-CM | POA: Insufficient documentation

## 2021-01-06 DIAGNOSIS — E119 Type 2 diabetes mellitus without complications: Secondary | ICD-10-CM | POA: Insufficient documentation

## 2021-01-06 DIAGNOSIS — R1013 Epigastric pain: Secondary | ICD-10-CM | POA: Diagnosis present

## 2021-01-06 DIAGNOSIS — E860 Dehydration: Secondary | ICD-10-CM | POA: Insufficient documentation

## 2021-01-06 DIAGNOSIS — I10 Essential (primary) hypertension: Secondary | ICD-10-CM | POA: Diagnosis not present

## 2021-01-06 DIAGNOSIS — E871 Hypo-osmolality and hyponatremia: Secondary | ICD-10-CM | POA: Insufficient documentation

## 2021-01-06 DIAGNOSIS — Z20822 Contact with and (suspected) exposure to covid-19: Secondary | ICD-10-CM | POA: Diagnosis not present

## 2021-01-06 DIAGNOSIS — Z7984 Long term (current) use of oral hypoglycemic drugs: Secondary | ICD-10-CM | POA: Insufficient documentation

## 2021-01-06 DIAGNOSIS — K760 Fatty (change of) liver, not elsewhere classified: Secondary | ICD-10-CM | POA: Insufficient documentation

## 2021-01-06 LAB — COMPREHENSIVE METABOLIC PANEL
ALT: 33 U/L (ref 0–44)
AST: 29 U/L (ref 15–41)
Albumin: 3.7 g/dL (ref 3.5–5.0)
Alkaline Phosphatase: 97 U/L (ref 38–126)
Anion gap: 11 (ref 5–15)
BUN: 17 mg/dL (ref 6–20)
CO2: 18 mmol/L — ABNORMAL LOW (ref 22–32)
Calcium: 9 mg/dL (ref 8.9–10.3)
Chloride: 91 mmol/L — ABNORMAL LOW (ref 98–111)
Creatinine, Ser: 1.52 mg/dL — ABNORMAL HIGH (ref 0.44–1.00)
GFR, Estimated: 41 mL/min — ABNORMAL LOW (ref >60)
Glucose, Bld: 134 mg/dL — ABNORMAL HIGH (ref 70–99)
Potassium: 3.4 mmol/L — ABNORMAL LOW (ref 3.5–5.1)
Sodium: 120 mmol/L — ABNORMAL LOW (ref 135–145)
Total Bilirubin: 0.5 mg/dL (ref 0.3–1.2)
Total Protein: 7.3 g/dL (ref 6.5–8.1)

## 2021-01-06 LAB — CBC WITH DIFFERENTIAL/PLATELET
Abs Immature Granulocytes: 0.06 10*3/uL (ref 0.00–0.07)
Basophils Absolute: 0 10*3/uL (ref 0.0–0.1)
Basophils Relative: 1 %
Eosinophils Absolute: 0.1 10*3/uL (ref 0.0–0.5)
Eosinophils Relative: 2 %
HCT: 32.3 % — ABNORMAL LOW (ref 36.0–46.0)
Hemoglobin: 11.4 g/dL — ABNORMAL LOW (ref 12.0–15.0)
Immature Granulocytes: 1 %
Lymphocytes Relative: 24 %
Lymphs Abs: 1.5 10*3/uL (ref 0.7–4.0)
MCH: 30.6 pg (ref 26.0–34.0)
MCHC: 35.3 g/dL (ref 30.0–36.0)
MCV: 86.6 fL (ref 80.0–100.0)
Monocytes Absolute: 0.6 10*3/uL (ref 0.1–1.0)
Monocytes Relative: 9 %
Neutro Abs: 4 10*3/uL (ref 1.7–7.7)
Neutrophils Relative %: 63 %
Platelets: 259 10*3/uL (ref 150–400)
RBC: 3.73 MIL/uL — ABNORMAL LOW (ref 3.87–5.11)
RDW: 12.7 % (ref 11.5–15.5)
WBC: 6.3 10*3/uL (ref 4.0–10.5)
nRBC: 0 % (ref 0.0–0.2)

## 2021-01-06 LAB — LIPASE, BLOOD: Lipase: 119 U/L — ABNORMAL HIGH (ref 11–51)

## 2021-01-06 LAB — URINALYSIS, ROUTINE W REFLEX MICROSCOPIC
Bilirubin Urine: NEGATIVE
Glucose, UA: NEGATIVE mg/dL
Hgb urine dipstick: NEGATIVE
Ketones, ur: NEGATIVE mg/dL
Leukocytes,Ua: NEGATIVE
Nitrite: NEGATIVE
Protein, ur: NEGATIVE mg/dL
Specific Gravity, Urine: <1.005 — ABNORMAL LOW (ref 1.005–1.030)
pH: 6.5 (ref 5.0–8.0)

## 2021-01-06 LAB — RESP PANEL BY RT-PCR (FLU A&B, COVID) ARPGX2
Influenza A by PCR: NEGATIVE
Influenza B by PCR: NEGATIVE
SARS Coronavirus 2 by RT PCR: NEGATIVE

## 2021-01-06 LAB — LACTIC ACID, PLASMA: Lactic Acid, Venous: 1 mmol/L (ref 0.5–1.9)

## 2021-01-06 MED ORDER — ONDANSETRON HCL 4 MG PO TABS: 4.0000 mg | ORAL_TABLET | Freq: Three times a day (TID) | ORAL | 0 refills | Status: DC | PRN

## 2021-01-06 MED ORDER — IOHEXOL 300 MG/ML  SOLN
100.0000 mL | Freq: Once | INTRAMUSCULAR | Status: AC | PRN
  Administered 2021-01-06: 75 mL via INTRAVENOUS

## 2021-01-06 MED ORDER — ONDANSETRON HCL 4 MG/2ML IJ SOLN
4.0000 mg | Freq: Once | INTRAMUSCULAR | Status: AC
  Administered 2021-01-06: 4 mg via INTRAVENOUS
  Filled 2021-01-06: qty 2

## 2021-01-06 MED ORDER — SODIUM CHLORIDE 0.9 % IV BOLUS
1000.0000 mL | Freq: Once | INTRAVENOUS | Status: AC
  Administered 2021-01-06: 1000 mL via INTRAVENOUS

## 2021-01-06 NOTE — ED Triage Notes (Signed)
Diagnosed with pancreatitis by PCP.  Sent here for CT scan and IV fluids.  Currently pain free.  Was having pain in RUQ abdomen.

## 2021-01-06 NOTE — ED Notes (Signed)
Pt. Has no complaints at this time. Tolerated PO challenge w/o Issue.

## 2021-01-06 NOTE — ED Notes (Signed)
Per MD Pt. provided drink and food for PO challenge.

## 2021-01-06 NOTE — Discharge Instructions (Addendum)
Your work-up today confirm you still have the dehydration with low sodium, chloride, and mildly low potassium.  The CT scan did not show evidence of abscess, necrosis, diverticulitis, obstruction, or other acute abnormality and your lipase was improved from yesterday.  We had a long shared decision-making conversation and agreed with you being able to go home as you have proven stability in regards to your vital signs and your ability to maintain hydration.  Please fill the prescription for nausea medicine to help with if you get nauseous again and please push hydration for the next several days.  Please follow-up with your primary doctor in several days for repeat lab assessment and if any symptoms change or worsen acutely, please return to the nearest emergency department.

## 2021-01-06 NOTE — Progress Notes (Signed)
Vanessa Valdez,   Hemoglobin normal range.  Kidney function has drastically declined from GFR 78 to 39. We call this acute kidney injury. Sodium is low at 121.  One liver enzyme slightly elevated.  Lipase is very elevated. You have pancreatitis. At this point you need a CT of abdomen even though ultrasound did not show anything and IV fluids and monitoring. Suggest to go to [facility].

## 2021-01-06 NOTE — Progress Notes (Signed)
Vanessa Valdez,   Ultrasound shows no gallstones or wall thickening. Both kidneys no abnormality. Liver is heterogenous and consistent with fatty liver disease.

## 2021-01-06 NOTE — ED Notes (Signed)
ED Provider at bedside. 

## 2021-01-06 NOTE — ED Notes (Signed)
Patient transported to CT 

## 2021-01-06 NOTE — ED Provider Notes (Signed)
[facility] EMERGENCY DEPARTMENT Provider Note   CSN: [id number] Arrival date & time: 01/06/21  0935     History Chief Complaint  Patient presents with   Pancreatitis    Vanessa Valdez is a 55 y.o. female.  The history is provided by the patient and medical records. No language interpreter was used.  Abdominal Pain Pain location:  Epigastric, LUQ and RUQ Pain quality: aching and cramping   Pain radiates to:  Does not radiate Pain severity:  Moderate Onset quality:  Gradual Duration:  3 weeks Timing:  Intermittent Progression:  Waxing and waning Chronicity:  New Context: not trauma   Worsened by:  Eating Ineffective treatments:  None tried Associated symptoms: chills (resolved), diarrhea, fatigue, fever (esolved), nausea and vomiting   Associated symptoms: no chest pain, no constipation, no cough, no dysuria, no melena, no shortness of breath, no vaginal bleeding and no vaginal discharge       Past Medical History:  Diagnosis Date   Allergy    Depression    Diabetes mellitus without complication (HCC)    Hypertension 1997   Impaired fasting glucose    Insomnia    Migraines    Preeclampsia    Vaginal Pap smear, abnormal     Patient Active Problem List   Diagnosis Date Noted   Fatty liver disease, nonalcoholic 01/06/2021   Hyponatremia 01/06/2021   AKI (acute kidney injury) (HCC) 01/06/2021   Mid back pain on right side 01/02/2021   Fall 01/02/2021   BMI 27.0-27.9,adult 08/14/2020   DEPRESSION, INITIAL EPISODE 07/03/2007   IMPAIRED FASTING GLUCOSE 05/03/2007   INSOMNIA, PERSISTENT 04/12/2007   Migraine without aura 04/12/2007   HYPERTENSION, BENIGN ESSENTIAL 04/12/2007    History reviewed. No pertinent surgical history.   OB History     Gravida  2   Para  1   Term      Preterm      AB  1   Living  1      SAB      IAB      Ectopic      Multiple      Live Births              Family History  Problem Relation Age of  Onset   Hypertension Mother    Diabetes Father    Cancer Paternal Grandmother 83       colon   Cancer Paternal Grandfather        myleoma    Social History   Tobacco Use   Smoking status: Never   Smokeless tobacco: Never  Substance Use Topics   Alcohol use: Yes   Drug use: No    Home Medications Prior to Admission medications   Medication Sig Start Date End Date Taking? Authorizing Provider  cholecalciferol (VITAMIN D3) 25 MCG (1000 UNIT) tablet Take 1,000 Units by mouth daily.    [provider]  esomeprazole (NEXIUM) 20 MG capsule Take 20 mg by mouth daily at 12 noon.    [provider]  lisinopril-hydrochlorothiazide (ZESTORETIC) 20-25 MG tablet TAKE 1 TABLET BY MOUTH DAILY 10/13/20   Agapito Games, MD  metFORMIN (GLUCOPHAGE) 500 MG tablet TAKE 1 TABLET(500 MG) BY MOUTH TWICE DAILY WITH A MEAL 10/13/20   Agapito Games, MD  methocarbamol (ROBAXIN) 500 MG tablet Take 1 tablet (500 mg total) by mouth 3 (three) times daily. 01/02/21   Breeback, Jade L, PA-C  metoprolol tartrate (LOPRESSOR) 100 MG tablet TAKE 1  TABLET(100 MG) BY MOUTH TWICE DAILY 08/14/20   Agapito Games, MD  ondansetron (ZOFRAN) 8 MG tablet Take 1 tablet (8 mg total) by mouth every 8 (eight) hours as needed for nausea or vomiting. 01/02/21   Tandy Gaw L, PA-C    Allergies    Sumatriptan, Codeine sulfate, Penicillins, Zomig [zolmitriptan], Erythromycin, and Garlic  Review of Systems   Review of Systems  Constitutional:  Positive for chills (resolved), fatigue and fever (esolved). Negative for diaphoresis.  HENT:  Negative for congestion.   Eyes:  Negative for visual disturbance.  Respiratory:  Negative for cough, chest tightness and shortness of breath.   Cardiovascular:  Negative for chest pain, palpitations and leg swelling.  Gastrointestinal:  Positive for abdominal pain, diarrhea, nausea and vomiting. Negative for constipation and melena.  Genitourinary:  Negative  for dysuria, flank pain, frequency, vaginal bleeding and vaginal discharge.  Musculoskeletal:  Negative for back pain.  Skin:  Negative for rash and wound.  Neurological:  Negative for dizziness, weakness, light-headedness and numbness.  Psychiatric/Behavioral:  Negative for agitation and confusion.   All other systems reviewed and are negative.  Physical Exam Updated Vital Signs BP (!) 141/86 (BP Location: Right Arm)   Pulse 86   Temp 97.9 F (36.6 C) (Oral)   Resp 18   Ht 5\' 3"  (1.6 m)   Wt 71.7 kg   LMP 05/17/2017 (Approximate)   SpO2 100%   BMI 27.99 kg/m   Physical Exam Vitals and nursing note reviewed.  Constitutional:      General: She is not in acute distress.    Appearance: She is well-developed. She is not ill-appearing, toxic-appearing or diaphoretic.  HENT:     Head: Normocephalic and atraumatic.     Mouth/Throat:     Mouth: Mucous membranes are dry.     Pharynx: No oropharyngeal exudate or posterior oropharyngeal erythema.  Eyes:     Conjunctiva/sclera: Conjunctivae normal.  Cardiovascular:     Rate and Rhythm: Normal rate and regular rhythm.     Heart sounds: No murmur heard. Pulmonary:     Effort: Pulmonary effort is normal. No respiratory distress.     Breath sounds: Normal breath sounds. No wheezing, rhonchi or rales.  Chest:     Chest wall: No tenderness.  Abdominal:     General: Abdomen is flat.     Palpations: Abdomen is soft.     Tenderness: There is no abdominal tenderness. There is no right CVA tenderness, left CVA tenderness, guarding or rebound.  Musculoskeletal:        General: No tenderness.     Cervical back: Neck supple.     Right lower leg: No edema.     Left lower leg: No edema.  Skin:    General: Skin is warm and dry.     Capillary Refill: Capillary refill takes less than 2 seconds.     Findings: No erythema.  Neurological:     General: No focal deficit present.     Mental Status: She is alert.  Psychiatric:        Mood and  Affect: Mood normal.    ED Results / Procedures / Treatments   Labs (all labs ordered are listed, but only abnormal results are displayed) Labs Reviewed  CBC WITH DIFFERENTIAL/PLATELET - Abnormal; Notable for the following components:      Result Value   RBC 3.73 (*)    Hemoglobin 11.4 (*)    HCT 32.3 (*)    All other  components within normal limits  COMPREHENSIVE METABOLIC PANEL - Abnormal; Notable for the following components:   Sodium 120 (*)    Potassium 3.4 (*)    Chloride 91 (*)    CO2 18 (*)    Glucose, Bld 134 (*)    Creatinine, Ser 1.52 (*)    GFR, Estimated 41 (*)    All other components within normal limits  LIPASE, BLOOD - Abnormal; Notable for the following components:   Lipase 119 (*)    All other components within normal limits  URINALYSIS, ROUTINE W REFLEX MICROSCOPIC - Abnormal; Notable for the following components:   Specific Gravity, Urine <1.005 (*)    All other components within normal limits  RESP PANEL BY RT-PCR (FLU A&B, COVID) ARPGX2  URINE CULTURE  LACTIC ACID, PLASMA  LACTIC ACID, PLASMA    EKG None  Radiology US Abdomen Complete  ResulKoreat Date: 01/05/2021 CLINICAL DATA:  Epigastric abdominal pain for the past 2 weeks with nausea and diarrhea, worse after eating. EXAM: ABDOMEN ULTRASOUND COMPLETE COMPARISON:  None. FINDINGS: Gallbladder: No gallstones or wall thickening visualized. No sonographic Murphy sign noted by sonographer. Common bile duct: Diameter: 2.4 mm Liver: Diffusely echogenic and mildly heterogeneous. No visible masses. Portal vein is patent on color Doppler imaging with normal direction of blood flow towards the liver. IVC: No abnormality visualized. Pancreas: Visualized portion unremarkable. Spleen: Size and appearance within normal limits. Right Kidney: Length: 10.4 cm. Echogenicity within normal limits. No mass or hydronephrosis visualized. Left Kidney: Length: 10.0 cm. Echogenicity within normal limits. No mass or hydronephrosis  visualized. Abdominal aorta: No aneurysm visualized. Other findings: None. IMPRESSION: 1. No acute abnormality. 2. Diffusely echogenic and mildly heterogeneous liver. This is most likely due to diffuse steatosis. This can also be seen with chronic hepatitis and cirrhosis. Electronically Signed   By: Beckie SaltsSteven  Reid M.D.   On: 01/05/2021 13:56    Procedures Procedures   Medications Ordered in ED Medications  sodium chloride 0.9 % bolus 1,000 mL (0 mLs Intravenous Stopped 01/06/21 1216)  ondansetron (ZOFRAN) injection 4 mg (4 mg Intravenous Given 01/06/21 1113)  iohexol (OMNIPAQUE) 300 MG/ML solution 100 mL (75 mLs Intravenous Contrast Given 01/06/21 1041)  sodium chloride 0.9 % bolus 1,000 mL (0 mLs Intravenous Stopped 01/06/21 1324)    ED Course  I have reviewed the triage vital signs and the nursing notes.  Pertinent labs & imaging results that were available during my care of the patient were reviewed by me and considered in my medical decision making (see chart for details).    MDM Rules/Calculators/A&P                          Vanessa Valdez is a 55 y.o. female with a past medical history significant for nonalcoholic fatty liver disease, hypertension, migraines, depression, and diabetes who presents at the direction of PCP for further evaluation of nausea, vomiting, diarrhea, and upper abdominal pain.  According to patient, over the last several days she was seen by her PCP and a virtual visit and had labs and ultrasound performed for the upper abdominal right upper quadrant pain she was having.  She reports for 3-week she has had intermittent episodes of nausea, vomiting, diarrhea, and upper abdominal pain and had some fevers and chills initially.  That has improved.  She had an ultrasound right upper quadrant that did not show acute cholecystitis but did show some fatty liver disease but had labs that were  concerning for AKI and hyponatremia down to 121.  Lipase is also elevated reportedly.   Patient was told to come in for fluids, CT scan to rule out abscess with her pancreatitis, rule out cholecystitis, or other intra-abdominal pathology.  She would also need fluids and repeat labs to make sure she does not need admission for dehydration and pancreatitis.  On exam, abdomen is nontender.  Bowel sounds appreciated.  Lungs clear, back some flank nontender.  Exam otherwise reassuring which shows a dry mucous membranes.  Vital signs did not reveal tachycardia, tachypnea, or hypotension.  She is afebrile.  Patient will get repeat labs and get some fluids and nausea medicine.  She is not having pain now so we will hold on pain medicine.  We will get CT scan as directed by her PCP and will look for other abnormalities.  Based on work-up, will discuss disposition.         12:16 PM Labs began to return.  Patient persistently has hyponatremia with a sodium of 120 instead of 121.  Her AKI is persistent at 1.52 up from 1.50.  Other electrolytes similar.  Her CT scan does not show evidence of abscess, diverticulitis, or other obstruction.  Her lipase is improving but still elevated.  We had a shared decision-making conversation about management and she would like to try going home if possible.  She agrees to get other liter fluids, p.o. challenge, and reassess.  Given her worsening hyponatremia and dehydration, I am concerned about this however if possible she would like to try going home.  Will reassess after more fluids and if she still feels well, we will let her go home to follow-up PCP in the next few days.  2:42 PM Patient was able to tolerate p.o. and felt much better after 2 L of fluids.  We offered her admission again but she wants to try going home.  She will see her PCP in the neck several days for repeat lab assessment and understands extremely strict return precautions for any new or worsening symptoms.  She is to wants to go.  Will give prescription for nausea medicine and she will be  discharged with understanding of plan of care.  Patient discharged in good condition with reassuring vital signs on my reassessment without any hypotension, tachycardia, or other complaints.    Final Clinical Impression(s) / ED Diagnoses Final diagnoses:  Dehydration  AKI (acute kidney injury) (HCC)  Hyponatremia    Rx / DC Orders ED Discharge Orders          Ordered    ondansetron (ZOFRAN) 4 MG tablet  Every 8 hours PRN        01/06/21 1445           Clinical Impression: 1. Dehydration   2. AKI (acute kidney injury) (HCC)   3. Hyponatremia     Disposition: Discharge  Condition: Good  I have discussed the results, Dx and Tx plan with the pt(& family if present). He/she/they expressed understanding and agree(s) with the plan. Discharge instructions discussed at great length. Strict return precautions discussed and pt &/or family have verbalized understanding of the instructions. No further questions at time of discharge.    New Prescriptions   ONDANSETRON (ZOFRAN) 4 MG TABLET    Take 1 tablet (4 mg total) by mouth every 8 (eight) hours as needed for nausea or vomiting.    Follow Up: Agapito Games, MD [address] [address] [place] [address]  [phone]     [facility] EMERGENCY DEPARTMENT [address] [id number] [address] [facility] [address] [place] [address] [phone]       Soua Lenk, Canary Brim, MD 01/06/21 [phone]

## 2021-01-07 ENCOUNTER — Telehealth: Payer: Self-pay | Admitting: General Practice

## 2021-01-07 LAB — URINE CULTURE: Culture: <10000 — AB

## 2021-01-07 NOTE — Telephone Encounter (Signed)
Transition Care Management Follow-up Telephone Call Date of discharge and from where: 01/06/21 from [facility]  How have you been since you were released from the hospital? Feeling better; no more pain or vomiting. Any questions or concerns? No  Items Reviewed: Did the pt receive and understand the discharge instructions provided? Yes  Medications obtained and verified? Yes  Other? No  Any new allergies since your discharge? No  Dietary orders reviewed? Yes Do you have support at home? Yes   Home Care and Equipment/Supplies: Were home health services ordered? no   Functional Questionnaire: (I = Independent and D = Dependent) ADLs: I  Bathing/Dressing- I  Meal Prep- I  Eating- I  Maintaining continence- I  Transferring/Ambulation- I  Managing Meds- I  Follow up appointments reviewed:  PCP Hospital f/u appt confirmed? Yes  Scheduled to see Tandy Gaw, PA on 01/12/21 @ 0850. Specialist Hospital f/u appt confirmed? No   Are transportation arrangements needed? No  If their condition worsens, is the pt aware to call PCP or go to the Emergency Dept.? Yes Was the patient provided with contact information for the PCP's office or ED? Yes Was to pt encouraged to call back with questions or concerns? Yes

## 2021-01-11 ENCOUNTER — Other Ambulatory Visit: Payer: Self-pay | Admitting: Family Medicine

## 2021-01-11 DIAGNOSIS — I10 Essential (primary) hypertension: Secondary | ICD-10-CM

## 2021-01-11 DIAGNOSIS — R7301 Impaired fasting glucose: Secondary | ICD-10-CM

## 2021-01-12 ENCOUNTER — Encounter: Payer: Self-pay | Admitting: Physician Assistant

## 2021-01-12 ENCOUNTER — Ambulatory Visit (INDEPENDENT_AMBULATORY_CARE_PROVIDER_SITE_OTHER): Payer: 59 | Admitting: Physician Assistant

## 2021-01-12 ENCOUNTER — Other Ambulatory Visit: Payer: Self-pay

## 2021-01-12 VITALS — BP 118/51 | HR 79 | Temp 98.2°F | Resp 20 | Ht 63.0 in | Wt 155.0 lb

## 2021-01-12 DIAGNOSIS — I2510 Atherosclerotic heart disease of native coronary artery without angina pectoris: Secondary | ICD-10-CM

## 2021-01-12 DIAGNOSIS — Z8262 Family history of osteoporosis: Secondary | ICD-10-CM

## 2021-01-12 DIAGNOSIS — K859 Acute pancreatitis without necrosis or infection, unspecified: Secondary | ICD-10-CM

## 2021-01-12 DIAGNOSIS — S22070D Wedge compression fracture of T9-T10 vertebra, subsequent encounter for fracture with routine healing: Secondary | ICD-10-CM | POA: Diagnosis not present

## 2021-01-12 DIAGNOSIS — N179 Acute kidney failure, unspecified: Secondary | ICD-10-CM | POA: Diagnosis not present

## 2021-01-12 DIAGNOSIS — Z13820 Encounter for screening for osteoporosis: Secondary | ICD-10-CM

## 2021-01-12 DIAGNOSIS — E871 Hypo-osmolality and hyponatremia: Secondary | ICD-10-CM

## 2021-01-12 DIAGNOSIS — S22000D Wedge compression fracture of unspecified thoracic vertebra, subsequent encounter for fracture with routine healing: Secondary | ICD-10-CM | POA: Insufficient documentation

## 2021-01-12 DIAGNOSIS — Z8719 Personal history of other diseases of the digestive system: Secondary | ICD-10-CM | POA: Insufficient documentation

## 2021-01-12 MED ORDER — ATORVASTATIN CALCIUM 20 MG PO TABS: 20.0000 mg | ORAL_TABLET | Freq: Every day | ORAL | 3 refills | Status: DC

## 2021-01-12 NOTE — Progress Notes (Signed)
Subjective:    Patient ID: Vanessa Valdez, female    DOB: 1965-12-18, 55 y.o.   MRN: [id number]  HPI Pt is a 55 yo female who presents to the clinic for hospital follow up for compression fracture and pancreatitis with AKI and hyponateremia. She was sent to the ED due to sodium 121, lipase above 300. CT was done in ED and showed no reason for pancreatitis but CAD and fatty liver and T10 compression fracture. She was given 2L of fluid and discharged with pancreatitis diet.   She is feeling much better. She denies and nausea or vomiting. She is drinking mostly but did eat some cheese yesterday. Not taking ibuprofen.   She did fall 2 weeks ago and would explain compression fracture. Her mother does have osteoporosis. She is taking vitamin D but no calcium. Never had bone density.   Active Ambulatory Problems    Diagnosis Date Noted   INSOMNIA, PERSISTENT 04/12/2007   DEPRESSION, INITIAL EPISODE 07/03/2007   Migraine without aura 04/12/2007   HYPERTENSION, BENIGN ESSENTIAL 04/12/2007   IMPAIRED FASTING GLUCOSE 05/03/2007   BMI 27.0-27.9,adult 08/14/2020   Mid back pain on right side 01/02/2021   Fall 01/02/2021   Fatty liver disease, nonalcoholic 01/06/2021   Hyponatremia 01/06/2021   AKI (acute kidney injury) (HCC) 01/06/2021   Coronary artery disease involving native heart without angina pectoris 01/12/2021   Compression fracture of thoracic vertebra with routine healing 01/12/2021   Acute pancreatitis 01/12/2021   Resolved Ambulatory Problems    Diagnosis Date Noted   OBESITY, CLASS I 05/29/2010   INFLUENZA DUE TO ID NOVEL H1N1 INFLUENZA VIRUS 05/06/2008   Obesity (BMI 30.0-34.9) 12/03/2014   Needs flu shot 03/24/2016   Influenza 08/24/2016   Past Medical History:  Diagnosis Date   Allergy    Depression    Diabetes mellitus without complication (HCC)    Hypertension 1997   Insomnia    Migraines    Preeclampsia    Vaginal Pap smear, abnormal      Review of Systems See  HPI.     Objective:   Physical Exam Vitals reviewed.  Constitutional:      Appearance: Normal appearance. She is obese. She is not ill-appearing.  HENT:     Head: Normocephalic.  Cardiovascular:     Rate and Rhythm: Normal rate and regular rhythm.     Heart sounds: No murmur heard. Pulmonary:     Effort: Pulmonary effort is normal.     Breath sounds: Normal breath sounds.  Abdominal:     General: Bowel sounds are normal. There is no distension.     Palpations: Abdomen is soft. There is no mass.     Tenderness: There is no abdominal tenderness. There is right CVA tenderness. There is no left CVA tenderness, guarding or rebound.  Musculoskeletal:     Right lower leg: No edema.     Left lower leg: No edema.  Neurological:     General: No focal deficit present.     Mental Status: She is alert and oriented to person, place, and time.  Psychiatric:        Mood and Affect: Mood normal.          Assessment & Plan:  Marland KitchenMarland KitchenDawn was seen today for follow-up.  Diagnoses and all orders for this visit:  Acute pancreatitis, unspecified complication status, unspecified pancreatitis type -     COMPLETE METABOLIC PANEL WITH GFR -     Lipase  Coronary artery disease  involving native heart without angina pectoris, unspecified vessel or lesion type -     atorvastatin (LIPITOR) 20 MG tablet; Take 1 tablet (20 mg total) by mouth daily. -     COMPLETE METABOLIC PANEL WITH GFR -     Ambulatory referral to Cardiology  Compression fracture of T10 vertebra with routine healing, subsequent encounter -     DG Bone Density; Future  AKI (acute kidney injury) (HCC) -     COMPLETE METABOLIC PANEL WITH GFR  Hyponatremia -     COMPLETE METABOLIC PANEL WITH GFR  Family history of osteoporosis -     DG Bone Density; Future  Osteoporosis screening -     DG Bone Density; Future   Recheck CmP and lipase for sodium, kidney function and resolution of pancreatitis.  Continue pancreatitis diet. No  clear cause. Avoid NSAIDs.  Avoid alcohol.  Stay hydrated.  LDL 100. Will start lipitor and get to cardiology for CV risk assessment due to CAD.  Discussed compression fracture.  Bone density ordered.  Continue with pain management. Avoid any lifting over 15lbs for 4 weeks. Follow up as needed in 4 weeks with PCP.

## 2021-01-12 NOTE — Patient Instructions (Signed)
Will get repeat labs.  Referral to cardiology.  Start lipitor.  Will order bone density.   Spinal Compression Fracture  A spinal compression fracture is a collapse of the bones that form the spine (vertebrae). With this type of fracture, the vertebrae become pushed (compressed) into a wedge shape. Most compression fractures happen in the middle or lowerpart of the spine. What are the causes? This condition may be caused by: Thinning and loss of density in the bones (osteoporosis). This is the most common cause. A fall. A car or motorcycle accident. Cancer. Trauma, such as a heavy, direct hit to the head or back. What increases the risk? You are more likely to develop this condition if: You are 1 years of age or older. You have osteoporosis. You have certain types of cancer, including: Multiple myeloma. Lymphoma. Prostate cancer. Lung cancer. Breast cancer. What are the signs or symptoms? Symptoms of this condition include: Severe pain with simple movements such as coughing or sneezing. Pain that gets worse over time. Pain that is worse when you stand, walk, sit, or bend. Sudden pain that is so bad that it is hard for you to move. Bending or humping of the spine. Gradual loss of height. Numbness, tingling, or weakness in the back and legs. Trouble walking. Your symptoms will depend on the cause of the fracture and how quickly itdevelops. How is this diagnosed? This condition may be diagnosed based on symptoms, medical history, and a physical exam. During the physical exam, your health care provider may tap along the length of your spine to check for tenderness. Tests may be done to confirm the diagnosis. They may include: A bone mineral density test to check for osteoporosis. Imaging tests, such as a spine X-ray, CT scan, or MRI. How is this treated? Treatment depends on the cause and severity of the condition. Some fractures may heal on their own with supportive care.  Treatment may include: Pain medicine. Rest. A back brace. Physical therapy exercises. Medicine to strengthen bone. Calcium and vitamin D supplements. Fractures that cause the back to become misshapen, cause nerve pain or weakness, or do not respond to other treatment may be treated with surgery. This may include: Vertebroplasty. Bone cement is injected into the collapsed vertebrae to stabilize them. Balloon kyphoplasty. The collapsed vertebrae are expanded with a balloon and then bone cement is injected into them. Spinal fusion. The collapsed vertebrae are connected (fused) to normal vertebrae. Follow these instructions at home: Medicines Take over-the-counter and prescription medicines only as told by your health care provider. Ask your health care provider if the medicine prescribed to you: Requires you to avoid driving or using machinery. Can cause constipation. You may need to take these actions to prevent or treat constipation: Drink enough fluid to keep your urine pale yellow. Take over-the-counter or prescription medicines. Eat foods that are high in fiber, such as beans, whole grains, and fresh fruits and vegetables. Limit foods that are high in fat and processed sugars, such as fried or sweet foods. If you have a brace: Wear the brace as told by your health care provider. Remove it only as told by your health care provider. Loosen the brace if your fingers or toes tingle, become numb, or turn cold and blue. Keep the brace clean. If the brace is not waterproof: Do not let it get wet. Cover it with a watertight covering when you take a bath or a shower. Managing pain, stiffness, and swelling  If directed, put ice  on the injured area. To do this: If you have a removable brace, remove it as told by your health care provider. Put ice in a plastic bag. Place a towel between your skin and the bag. Leave the ice on for 20 minutes, 2-3 times a day. Remove the ice if your skin turns  bright red. This is very important. If you cannot feel pain, heat, or cold, you have a greater risk of damage to the area.  Activity Rest as told by your health care provider. Avoid sitting for a long time without moving. Get up to take short walks every 1-2 hours. This is important to improve blood flow and breathing. Ask for help if you feel weak or unsteady. Return to your normal activities as told by your health care provider. Ask what activities are safe for you. Do physical therapy exercises to improve movement and strength in your back, as recommended by your health care provider. Exercise regularly as directed by your health care provider. General instructions  Do not drink alcohol. Alcohol can interfere with your treatment. Do not use any products that contain nicotine or tobacco, such as cigarettes, e-cigarettes, and chewing tobacco. These can delay bone healing. If you need help quitting, ask your health care provider. Keep all follow-up visits. This is important. It can help to prevent permanent injury, disability, and long-lasting (chronic) pain.  Contact a health care provider if: You have a fever. Your pain medicine is not helping. Your pain does not get better over time. You cannot return to your normal activities as planned or expected. Get help right away if: Your pain is very bad and it suddenly gets worse. You are unable to move any body part (paralysis) that is below the level of your injury. You have numbness, tingling, or weakness in any body part that is below the level of your injury. You cannot control your bladder or bowels. Summary A spinal compression fracture is a collapse of the bones that form the spine (vertebrae). With this type of fracture, the vertebrae become pushed (compressed) into a wedge shape. Your symptoms and treatment will depend on the cause and severity of the fracture and how quickly it develops. Some fractures may heal on their own with  supportive care. Fractures that cause the back to become misshapen, cause nerve pain or weakness, or do not respond to other treatment may be treated with surgery. This information is not intended to replace advice given to you by your health care provider. Make sure you discuss any questions you have with your healthcare provider. Document Revised: 10/24/2019 Document Reviewed: 10/24/2019 Elsevier Patient Education  2022 [place]. Pancreatitis Eating Plan Pancreatitis is when your pancreas becomes irritated and swollen (inflamed). The pancreas is a small organ located behind your stomach. It helps your body digest food and regulate your blood sugar. Pancreatitis can affect how your body digests food, especially foods with fat. You may also have othersymptoms such as abdominal pain or nausea. When you have pancreatitis, following a low-fat eating plan may help you manage symptoms and recover more quickly. Work with your health care provider or a diet and nutrition specialist (dietitian) to create an eating plan that is right for you. What are tips for following this plan? Reading food labels Use the information on food labels to help keep track of how much fat you eat: Check the serving size. Look for the amount of total fat in grams (g) in one serving. Low-fat foods have 3  g of fat or less per serving. Fat-free foods have 0.5 g of fat or less per serving. Keep track of how much fat you eat based on how many servings you eat. For example, if you eat two servings, the amount of fat you eat will be two times what is listed on the label. Shopping  Buy low-fat or nonfat foods, such as: Fresh, frozen, or canned fruits and vegetables. Grains, including pasta, bread, and rice. Lean meat, poultry, fish, and other protein foods. Low-fat or nonfat dairy. Avoid buying bakery products and other sweets made with whole milk, butter, and eggs. Avoid buying snack foods with added fat, such as anything  with butter or cheese flavoring.  Cooking Remove skin from poultry, and remove extra fat from meat. Limit the amount of fat and oil you use to 6 teaspoons or less per day. Cook using low-fat methods, such as boiling, broiling, grilling, steaming, or baking. Use spray oil to cook. Add fat-free chicken broth to add flavor and moisture. Avoid adding cream to thicken soups or sauces. Use other thickeners such as corn starch or tomato paste. Meal planning  Eat a low-fat diet as told by your dietitian. For most people, this means having no more than 55-65 grams of fat each day. Eat small, frequent meals throughout the day. For example, you may have 5-6 small meals instead of 3 large meals. Drink enough fluid to keep your urine pale yellow. Do not drink alcohol. Talk to your health care provider if you need help stopping. Limit how much caffeine you have, including black coffee, black and green tea, caffeinated soft drinks, and energy drinks.  General information Let your health care provider or dietitian know if you have unplanned weight loss on this eating plan. You may be instructed to follow a clear liquid diet during a flare of symptoms. Talk with your health care provider about how to manage your diet during symptoms of a flare. Take any vitamins or supplements as told by your health care provider. Work with a Microbiologist, especially if you have other conditions such as obesity or diabetes mellitus. What foods should I avoid? Fruits Fried fruits. Fruits served with butter or cream. Vegetables Fried vegetables. Vegetables cooked with butter, cheese, or cream. Grains Biscuits, waffles, donuts, pastries, and croissants. Pies and cookies.Butter-flavored popcorn. Regular crackers. Meats and other protein foods Fatty cuts of meat. Poultry with skin. Organ meats. Bacon, sausage, and coldcuts. Whole eggs. Nuts and nut butters. Dairy Whole and 2% milk. Whole milk yogurt. Whole milk ice cream.  Cream andhalf-and-half. Cream cheese. Sour cream. Cheese. Beverages Wine, beer, and liquor. The items listed above may not be a complete list of foods and beverages to avoid. Contact a dietitian for more information. Summary Pancreatitis can affect how your body digests food, especially foods with fat. When you have pancreatitis, it is recommended that you follow a low-fat eating plan to help you recover more quickly and manage symptoms. For most people, this means limiting fat to no more than 55-65 grams per day. Do not drink alcohol. Limit the amount of caffeine you have, and drink enough fluid to keep your urine pale yellow. This information is not intended to replace advice given to you by your health care provider. Make sure you discuss any questions you have with your healthcare provider. Document Revised: 10/26/2018 Document Reviewed: 10/11/2017 Elsevier Patient Education  [place].

## 2021-01-13 ENCOUNTER — Other Ambulatory Visit: Payer: Self-pay | Admitting: Physician Assistant

## 2021-01-13 ENCOUNTER — Encounter: Payer: Self-pay | Admitting: Physician Assistant

## 2021-01-13 DIAGNOSIS — E871 Hypo-osmolality and hyponatremia: Secondary | ICD-10-CM

## 2021-01-13 DIAGNOSIS — K859 Acute pancreatitis without necrosis or infection, unspecified: Secondary | ICD-10-CM

## 2021-01-13 DIAGNOSIS — N179 Acute kidney failure, unspecified: Secondary | ICD-10-CM

## 2021-01-13 DIAGNOSIS — R748 Abnormal levels of other serum enzymes: Secondary | ICD-10-CM

## 2021-01-13 DIAGNOSIS — R1013 Epigastric pain: Secondary | ICD-10-CM

## 2021-01-13 DIAGNOSIS — R110 Nausea: Secondary | ICD-10-CM

## 2021-01-13 LAB — LIPASE: Lipase: 247 U/L — ABNORMAL HIGH (ref 7–60)

## 2021-01-13 LAB — COMPLETE METABOLIC PANEL WITH GFR
AG Ratio: 1.5 (calc) (ref 1.0–2.5)
ALT: 26 U/L (ref 6–29)
AST: 20 U/L (ref 10–35)
Albumin: 4.1 g/dL (ref 3.6–5.1)
Alkaline phosphatase (APISO): 122 U/L (ref 37–153)
BUN: 10 mg/dL (ref 7–25)
CO2: 23 mmol/L (ref 20–32)
Calcium: 10 mg/dL (ref 8.6–10.4)
Chloride: 99 mmol/L (ref 98–110)
Creat: 0.95 mg/dL (ref 0.50–1.05)
GFR, Est African American: 79 mL/min/{1.73_m2} (ref >=60)
GFR, Est Non African American: 68 mL/min/{1.73_m2} (ref >=60)
Globulin: 2.7 g/dL (calc) (ref 1.9–3.7)
Glucose, Bld: 107 mg/dL — ABNORMAL HIGH (ref 65–99)
Potassium: 4.2 mmol/L (ref 3.5–5.3)
Sodium: 133 mmol/L — ABNORMAL LOW (ref 135–146)
Total Bilirubin: 0.4 mg/dL (ref 0.2–1.2)
Total Protein: 6.8 g/dL (ref 6.1–8.1)

## 2021-01-13 NOTE — Progress Notes (Signed)
Vanessa Valdez,   Kidney function MUCH better.  Sodium MUCH better but still a little low.  Potassium MUCH better.  Lipase is actually higher than 7 days ago where it had gone down some. We need to get you into GI for more testing. Referral place. Continue on pancreatitis diet.

## 2021-01-20 ENCOUNTER — Telehealth: Payer: Self-pay | Admitting: Neurology

## 2021-01-20 DIAGNOSIS — R748 Abnormal levels of other serum enzymes: Secondary | ICD-10-CM

## 2021-01-20 DIAGNOSIS — K859 Acute pancreatitis without necrosis or infection, unspecified: Secondary | ICD-10-CM

## 2021-01-20 NOTE — Telephone Encounter (Signed)
Patient left vm asking if she needed to repeat labs.  Looking at last note, it looks like the plan was to refer to GI and have them draw further labs if needed. I let patient know that. She is scheduled for next Thursday with GI. If we need labs before this please let me know.

## 2021-01-21 NOTE — Telephone Encounter (Signed)
Patient made aware. Labs ordered.

## 2021-01-21 NOTE — Addendum Note (Signed)
Addended bySilvio Pate on: 01/21/2021 10:41 AM   Modules accepted: Orders

## 2021-01-22 ENCOUNTER — Inpatient Hospital Stay (HOSPITAL_COMMUNITY): Payer: 59

## 2021-01-22 ENCOUNTER — Inpatient Hospital Stay (HOSPITAL_BASED_OUTPATIENT_CLINIC_OR_DEPARTMENT_OTHER)
Admission: EM | Admit: 2021-01-22 | Discharge: 2021-01-24 | DRG: 641 | Disposition: A | Discharge status: Home / Self Care | Payer: 59 | Attending: Internal Medicine | Admitting: Internal Medicine

## 2021-01-22 ENCOUNTER — Other Ambulatory Visit: Payer: Self-pay

## 2021-01-22 ENCOUNTER — Encounter (HOSPITAL_BASED_OUTPATIENT_CLINIC_OR_DEPARTMENT_OTHER): Payer: Self-pay | Admitting: *Deleted

## 2021-01-22 DIAGNOSIS — Z8249 Family history of ischemic heart disease and other diseases of the circulatory system: Secondary | ICD-10-CM

## 2021-01-22 DIAGNOSIS — E876 Hypokalemia: Secondary | ICD-10-CM | POA: Diagnosis present

## 2021-01-22 DIAGNOSIS — K760 Fatty (change of) liver, not elsewhere classified: Secondary | ICD-10-CM | POA: Diagnosis present

## 2021-01-22 DIAGNOSIS — E860 Dehydration: Secondary | ICD-10-CM | POA: Diagnosis present

## 2021-01-22 DIAGNOSIS — E119 Type 2 diabetes mellitus without complications: Secondary | ICD-10-CM | POA: Diagnosis present

## 2021-01-22 DIAGNOSIS — I2510 Atherosclerotic heart disease of native coronary artery without angina pectoris: Secondary | ICD-10-CM | POA: Diagnosis present

## 2021-01-22 DIAGNOSIS — K219 Gastro-esophageal reflux disease without esophagitis: Secondary | ICD-10-CM | POA: Diagnosis present

## 2021-01-22 DIAGNOSIS — Z28310 Unvaccinated for covid-19: Secondary | ICD-10-CM

## 2021-01-22 DIAGNOSIS — E871 Hypo-osmolality and hyponatremia: Secondary | ICD-10-CM | POA: Diagnosis present

## 2021-01-22 DIAGNOSIS — Z20822 Contact with and (suspected) exposure to covid-19: Secondary | ICD-10-CM | POA: Diagnosis present

## 2021-01-22 DIAGNOSIS — G8929 Other chronic pain: Secondary | ICD-10-CM | POA: Diagnosis present

## 2021-01-22 DIAGNOSIS — R7301 Impaired fasting glucose: Secondary | ICD-10-CM

## 2021-01-22 DIAGNOSIS — I10 Essential (primary) hypertension: Secondary | ICD-10-CM | POA: Diagnosis present

## 2021-01-22 DIAGNOSIS — Z833 Family history of diabetes mellitus: Secondary | ICD-10-CM

## 2021-01-22 LAB — COMPREHENSIVE METABOLIC PANEL
ALT: 47 U/L — ABNORMAL HIGH (ref 0–44)
ALT: 44 U/L (ref 0–44)
AST: 39 U/L (ref 15–41)
AST: 36 U/L (ref 15–41)
Albumin: 4.3 g/dL (ref 3.5–5.0)
Albumin: 4 g/dL (ref 3.5–5.0)
Alkaline Phosphatase: 115 U/L (ref 38–126)
Alkaline Phosphatase: 110 U/L (ref 38–126)
Anion gap: 13 (ref 5–15)
Anion gap: 12 (ref 5–15)
BUN: 8 mg/dL (ref 6–20)
BUN: 7 mg/dL (ref 6–20)
CO2: 19 mmol/L — ABNORMAL LOW (ref 22–32)
CO2: 18 mmol/L — ABNORMAL LOW (ref 22–32)
Calcium: 9 mg/dL (ref 8.9–10.3)
Calcium: 8.7 mg/dL — ABNORMAL LOW (ref 8.9–10.3)
Chloride: 88 mmol/L — ABNORMAL LOW (ref 98–111)
Chloride: 97 mmol/L — ABNORMAL LOW (ref 98–111)
Creatinine, Ser: 0.92 mg/dL (ref 0.44–1.00)
Creatinine, Ser: 0.97 mg/dL (ref 0.44–1.00)
GFR, Estimated: >60 mL/min (ref >60)
GFR, Estimated: >60 mL/min (ref >60)
Glucose, Bld: 107 mg/dL — ABNORMAL HIGH (ref 70–99)
Glucose, Bld: 105 mg/dL — ABNORMAL HIGH (ref 70–99)
Potassium: 2.7 mmol/L — CL (ref 3.5–5.1)
Potassium: 3.1 mmol/L — ABNORMAL LOW (ref 3.5–5.1)
Sodium: 120 mmol/L — ABNORMAL LOW (ref 135–145)
Sodium: 127 mmol/L — ABNORMAL LOW (ref 135–145)
Total Bilirubin: 0.9 mg/dL (ref 0.3–1.2)
Total Bilirubin: 1 mg/dL (ref 0.3–1.2)
Total Protein: 7.7 g/dL (ref 6.5–8.1)
Total Protein: 7.1 g/dL (ref 6.5–8.1)

## 2021-01-22 LAB — PHOSPHORUS: Phosphorus: 2.4 mg/dL — ABNORMAL LOW (ref 2.5–4.6)

## 2021-01-22 LAB — OSMOLALITY, URINE: Osmolality, Ur: 156 mOsm/kg — ABNORMAL LOW (ref 300–900)

## 2021-01-22 LAB — URINALYSIS, ROUTINE W REFLEX MICROSCOPIC
Bilirubin Urine: NEGATIVE
Glucose, UA: NEGATIVE mg/dL
Hgb urine dipstick: NEGATIVE
Ketones, ur: NEGATIVE mg/dL
Leukocytes,Ua: NEGATIVE
Nitrite: NEGATIVE
Protein, ur: NEGATIVE mg/dL
Specific Gravity, Urine: <1.005 — ABNORMAL LOW (ref 1.005–1.030)
pH: 7 (ref 5.0–8.0)

## 2021-01-22 LAB — MAGNESIUM: Magnesium: 1.1 mg/dL — ABNORMAL LOW (ref 1.7–2.4)

## 2021-01-22 LAB — CBC WITH DIFFERENTIAL/PLATELET
Abs Immature Granulocytes: 0.02 10*3/uL (ref 0.00–0.07)
Basophils Absolute: 0.1 10*3/uL (ref 0.0–0.1)
Basophils Relative: 1 %
Eosinophils Absolute: 0.2 10*3/uL (ref 0.0–0.5)
Eosinophils Relative: 3 %
HCT: 35.4 % — ABNORMAL LOW (ref 36.0–46.0)
Hemoglobin: 12.8 g/dL (ref 12.0–15.0)
Immature Granulocytes: 0 %
Lymphocytes Relative: 28 %
Lymphs Abs: 1.9 10*3/uL (ref 0.7–4.0)
MCH: 30.7 pg (ref 26.0–34.0)
MCHC: 36.2 g/dL — ABNORMAL HIGH (ref 30.0–36.0)
MCV: 84.9 fL (ref 80.0–100.0)
Monocytes Absolute: 0.7 10*3/uL (ref 0.1–1.0)
Monocytes Relative: 11 %
Neutro Abs: 3.9 10*3/uL (ref 1.7–7.7)
Neutrophils Relative %: 57 %
Platelets: 326 10*3/uL (ref 150–400)
RBC: 4.17 MIL/uL (ref 3.87–5.11)
RDW: 12.4 % (ref 11.5–15.5)
WBC: 6.9 10*3/uL (ref 4.0–10.5)
nRBC: 0 % (ref 0.0–0.2)

## 2021-01-22 LAB — RESP PANEL BY RT-PCR (FLU A&B, COVID) ARPGX2
Influenza A by PCR: NEGATIVE
Influenza B by PCR: NEGATIVE
SARS Coronavirus 2 by RT PCR: NEGATIVE

## 2021-01-22 LAB — GLUCOSE, CAPILLARY
Glucose-Capillary: 92 mg/dL (ref 70–99)
Glucose-Capillary: 85 mg/dL (ref 70–99)

## 2021-01-22 LAB — LIPASE: Lipase: 61 U/L — ABNORMAL HIGH (ref 7–60)

## 2021-01-22 LAB — BASIC METABOLIC PANEL
BUN: 7 mg/dL (ref 7–25)
CO2: 20 mmol/L (ref 20–32)
Calcium: 9.1 mg/dL (ref 8.6–10.4)
Chloride: 87 mmol/L — ABNORMAL LOW (ref 98–110)
Creat: 0.92 mg/dL (ref 0.50–1.05)
Glucose, Bld: 103 mg/dL — ABNORMAL HIGH (ref 65–99)
Potassium: 3.4 mmol/L — ABNORMAL LOW (ref 3.5–5.3)
Sodium: 120 mmol/L — CL (ref 135–146)

## 2021-01-22 LAB — SODIUM, URINE, RANDOM: Sodium, Ur: 52 mmol/L

## 2021-01-22 LAB — TSH: TSH: 2.253 u[IU]/mL (ref 0.350–4.500)

## 2021-01-22 LAB — CK: Total CK: 51 U/L (ref 38–234)

## 2021-01-22 LAB — LIPASE, BLOOD: Lipase: 37 U/L (ref 11–51)

## 2021-01-22 LAB — CORTISOL: Cortisol, Plasma: 9.3 ug/dL

## 2021-01-22 LAB — OSMOLALITY: Osmolality: 251 mOsm/kg — ABNORMAL LOW (ref 275–295)

## 2021-01-22 LAB — CREATININE, URINE, RANDOM: Creatinine, Urine: 21.63 mg/dL

## 2021-01-22 MED ORDER — PANTOPRAZOLE SODIUM 40 MG PO TBEC
40.0000 mg | DELAYED_RELEASE_TABLET | Freq: Every day | ORAL | Status: DC
  Administered 2021-01-22 – 2021-01-24 (×3): 40 mg via ORAL
  Filled 2021-01-22 (×3): qty 1

## 2021-01-22 MED ORDER — SODIUM CHLORIDE 0.9 % IV SOLN: INTRAVENOUS | Status: DC

## 2021-01-22 MED ORDER — METOPROLOL TARTRATE 50 MG PO TABS
50.0000 mg | ORAL_TABLET | Freq: Two times a day (BID) | ORAL | Status: DC
  Administered 2021-01-22 – 2021-01-24 (×4): 50 mg via ORAL
  Filled 2021-01-22 (×4): qty 1

## 2021-01-22 MED ORDER — MAGNESIUM SULFATE 2 GM/50ML IV SOLN
2.0000 g | Freq: Once | INTRAVENOUS | Status: AC
  Administered 2021-01-22: 2 g via INTRAVENOUS
  Filled 2021-01-22: qty 50

## 2021-01-22 MED ORDER — INSULIN ASPART 100 UNIT/ML IJ SOLN: 0.0000 [IU] | INTRAMUSCULAR | Status: DC

## 2021-01-22 MED ORDER — ACETAMINOPHEN 325 MG PO TABS: 650.0000 mg | ORAL_TABLET | Freq: Four times a day (QID) | ORAL | Status: DC | PRN

## 2021-01-22 MED ORDER — SODIUM CHLORIDE 0.9 % IV BOLUS
1000.0000 mL | Freq: Once | INTRAVENOUS | Status: AC
  Administered 2021-01-22: 1000 mL via INTRAVENOUS

## 2021-01-22 MED ORDER — ONDANSETRON HCL 4 MG/2ML IJ SOLN
4.0000 mg | Freq: Once | INTRAMUSCULAR | Status: AC
  Administered 2021-01-22: 4 mg via INTRAVENOUS
  Filled 2021-01-22: qty 2

## 2021-01-22 MED ORDER — POTASSIUM CHLORIDE CRYS ER 20 MEQ PO TBCR
40.0000 meq | EXTENDED_RELEASE_TABLET | Freq: Once | ORAL | Status: AC
  Administered 2021-01-22: 40 meq via ORAL
  Filled 2021-01-22: qty 2

## 2021-01-22 MED ORDER — POTASSIUM CHLORIDE 10 MEQ/100ML IV SOLN
10.0000 meq | INTRAVENOUS | Status: AC
  Administered 2021-01-22 – 2021-01-23 (×4): 10 meq via INTRAVENOUS
  Filled 2021-01-22 (×4): qty 100

## 2021-01-22 MED ORDER — ATORVASTATIN CALCIUM 20 MG PO TABS
20.0000 mg | ORAL_TABLET | Freq: Every day | ORAL | Status: DC
  Administered 2021-01-23 – 2021-01-24 (×2): 20 mg via ORAL
  Filled 2021-01-22 (×3): qty 1

## 2021-01-22 MED ORDER — ENOXAPARIN SODIUM 30 MG/0.3ML IJ SOSY
30.0000 mg | PREFILLED_SYRINGE | INTRAMUSCULAR | Status: DC
  Administered 2021-01-22: 30 mg via SUBCUTANEOUS
  Filled 2021-01-22: qty 0.3

## 2021-01-22 MED ORDER — ACETAMINOPHEN 650 MG RE SUPP: 650.0000 mg | Freq: Four times a day (QID) | RECTAL | Status: DC | PRN

## 2021-01-22 MED ORDER — SODIUM CHLORIDE 0.9 % IV SOLN: 75.0000 mL/h | INTRAVENOUS | Status: AC

## 2021-01-22 MED ORDER — SODIUM PHOSPHATES 45 MMOLE/15ML IV SOLN
10.0000 mmol | Freq: Once | INTRAVENOUS | Status: AC
  Administered 2021-01-22: 10 mmol via INTRAVENOUS
  Filled 2021-01-22: qty 3.33

## 2021-01-22 NOTE — ED Triage Notes (Signed)
States her PA told her to come here due to low sodium. Hx of same 2 weeks ago.

## 2021-01-22 NOTE — H&P (Signed)
Vanessa Valdez:[id number] DOB: January 03, 1966 DOA: 01/22/2021     PCP: Agapito Games, MD   Outpatient Specialists:   CARDS: supposed to see in September    Patient arrived to ER on 01/22/21 at 1205 Referred by Attending Rodolph Bong, MD   Patient coming from: home Lives  With family    Chief Complaint:   Chief Complaint  Patient presents with   Abnormal Labs    HPI: Vanessa Valdez is a 55 y.o. female with medical history significant of DM2, HTN, hyponatremia, GERD    Presented with   hyponatremia  Was seen in ER on 21 of June w N/v/D RUQ pain   did not show acute cholecystitis but did show some fatty liver disease but had labs that were concerning for AKI and hyponatremia down to 121.Na was 120 lipase elevated to 119  CT scan did not show evidence of abscess, diverticulitis, or other obstruction. Pt  felt much better after 2 L of fluids and went home  Patient denies any alcohol Her PCP rechecked her labs again today and noted na back down to 120 lipase now normal and symptoms resolved except for last night She has been dringking water and Gatoraid 48oz of water and 32 of Gatoraid About 1y ago she had low sodium and was taken salt tabs with improvement She was told to go to ER Was a bit lightheaded night she was trying ot stand up no sick contacts  Used to drink no ET Oh for 3 wks  She has had a few drinks a week in a past  Reports for the past 2 wks only hand rice and english muffin Due to recent pancreatitis    Has  NOt been vaccinated against COVID     Initial COVID TEST  NEGATIVE   Lab Results  Component Value Date   SARSCOV2NAA NEGATIVE 01/22/2021   SARSCOV2NAA NEGATIVE 01/06/2021     Regarding pertinent Chronic problems:    Hyperlipidemia -  on statins Lipitor Lipid Panel     Component Value Date/Time   CHOL 205 (H) 02/20/2020 0711   TRIG 135 02/20/2020 0711   HDL 82 02/20/2020 0711   CHOLHDL 2.5 02/20/2020 0711   VLDL 27 05/17/2016 0908    LDLCALC 100 (H) 02/20/2020 0711    HTN on Lisinopril/hctz, metoprolol      DM 2 -  Lab Results  Component Value Date   HGBA1C 5.5 08/14/2020   on  PO meds only,      obesity-   BMI Readings from Last 1 Encounters:  01/22/21 27.10 kg/m       Liver disease  fatty liver  While in ER: Found to have Na of 120 and K of 2.8     ED Triage Vitals  Enc Vitals Group     BP 01/22/21 1216 131/70     Pulse Rate 01/22/21 1216 72     Resp 01/22/21 1216 14     Temp 01/22/21 1216 98.2 F (36.8 C)     Temp Source 01/22/21 1216 Oral     SpO2 01/22/21 1216 100 %     Weight 01/22/21 1216 153 lb (69.4 kg)     Height 01/22/21 1216 5\' 3"  (1.6 m)     Head Circumference --      Peak Flow --      Pain Score 01/22/21 1215 0     Pain Loc --      Pain Edu? --  Excl. in GC? --   ZOXW(96)@     _________________________________________ Significant initial  Findings: Abnormal Labs Reviewed  CBC WITH DIFFERENTIAL/PLATELET - Abnormal; Notable for the following components:      Result Value   HCT 35.4 (*)    MCHC 36.2 (*)    All other components within normal limits  COMPREHENSIVE METABOLIC PANEL - Abnormal; Notable for the following components:   Sodium 120 (*)    Potassium 2.7 (*)    Chloride 88 (*)    CO2 19 (*)    Glucose, Bld 107 (*)    ALT 47 (*)    All other components within normal limits  URINALYSIS, ROUTINE W REFLEX MICROSCOPIC - Abnormal; Notable for the following components:   Specific Gravity, Urine <1.005 (*)    All other components within normal limits  OSMOLALITY, URINE - Abnormal; Notable for the following components:   Osmolality, Ur 156 (*)    All other components within normal limits  OSMOLALITY - Abnormal; Notable for the following components:   Osmolality 251 (*)    All other components within normal limits   ____________________________________________   [place] 6/20 Diffusely echogenic and mildly heterogeneous liver. This is most likely due to diffuse  steatosis. This can also be seen with chronic hepatitis and cirrhosis.  CXR -  NON acute    CTabd/pelvis 6/21 -  nonacute, hepatic steatosis    _________________________   ECG: Ordered   ________  The recent clinical data is shown below. Vitals:   01/22/21 1630 01/22/21 1830 01/22/21 1914 01/22/21 1956  BP: 125/83 115/69  127/74  Pulse: 71 72  77  Resp: 10 15  18   Temp:   98.4 F (36.9 C)   TempSrc:   Oral   SpO2: 100% 100%  100%  Weight:      Height:          WBC     Component Value Date/Time   WBC 6.9 01/22/2021 1347   LYMPHSABS 1.9 01/22/2021 1347   MONOABS 0.7 01/22/2021 1347   EOSABS 0.2 01/22/2021 1347   BASOSABS 0.1 01/22/2021 1347     Lactic Acid, Venous    Component Value Date/Time   LATICACIDVEN 1.0 01/06/2021 1030      UA  no evidence of UTI      Urine analysis:    Component Value Date/Time   COLORURINE YELLOW 01/22/2021 1449   APPEARANCEUR CLEAR 01/22/2021 1449   LABSPEC <1.005 (L) 01/22/2021 1449   PHURINE 7.0 01/22/2021 1449   GLUCOSEU NEGATIVE 01/22/2021 1449   HGBUR NEGATIVE 01/22/2021 1449   BILIRUBINUR NEGATIVE 01/22/2021 1449   KETONESUR NEGATIVE 01/22/2021 1449   PROTEINUR NEGATIVE 01/22/2021 1449   NITRITE NEGATIVE 01/22/2021 1449   LEUKOCYTESUR NEGATIVE 01/22/2021 1449    Results for orders placed or performed during the hospital encounter of 01/22/21  Resp Panel by RT-PCR (Flu A&B, Covid) Nasopharyngeal Swab     Status: None   Collection Time: 01/22/21  2:49 PM   Specimen: Nasopharyngeal Swab; Nasopharyngeal(NP) swabs in vial transport medium  Result Value Ref Range Status   SARS Coronavirus 2 by RT PCR NEGATIVE NEGATIVE Final         Influenza A by PCR NEGATIVE NEGATIVE Final   Influenza B by PCR NEGATIVE NEGATIVE Final            _______________________________________________ Hospitalist was called for admission for hyponatremia  The following Work up has been ordered so far:  Orders Placed This Encounter   Procedures  Resp Panel by RT-PCR (Flu A&B, Covid) Nasopharyngeal Swab   CBC with Differential/Platelet   Comprehensive metabolic panel   Lipase, blood   Urinalysis, Routine w reflex microscopic   Creatinine, urine, random   TSH   Sodium, urine, random   Osmolality, urine   Osmolality   Cortisol, Random   Cardiac monitoring   Consult to hospitalist   Airborne and Contact precautions   Admit to Inpatient (patient's expected length of stay will be greater than 2 midnights or inpatient only procedure)      Following Medications were ordered in ER: Medications  0.9 %  sodium chloride infusion ( Intravenous New Bag/Given 01/22/21 1504)  sodium chloride 0.9 % bolus 1,000 mL (0 mLs Intravenous Stopped 01/22/21 1502)  potassium chloride SA (KLOR-CON) CR tablet 40 mEq (40 mEq Oral Given 01/22/21 1448)  ondansetron (ZOFRAN) injection 4 mg (4 mg Intravenous Given 01/22/21 1905)        Consult Orders  (From admission, onward)           Start     Ordered   01/22/21 1441  Consult to hospitalist  Called [facility] @ 14:46  Once       Provider:  (Not yet assigned)  Question Answer Comment  Place call to: [id number] Hyponatremia and Hypokalemia   Reason for Consult Admit      01/22/21 1440              OTHER Significant initial  Findings:  labs showing:    Recent Labs  Lab 01/21/21 0000 01/22/21 1347 01/22/21 2044  NA 120* 120* 127*  K 3.4* 2.7* 3.1*  CO2 20 19* 18*  GLUCOSE 103* 107* 105*  BUN 7 8 7   CREATININE 0.92 0.92 0.97  CALCIUM 9.1 9.0 8.7*  MG  --   --  1.1*  PHOS  --   --  2.4*    Cr   stable,    Lab Results  Component Value Date   CREATININE 0.92 01/22/2021   CREATININE 0.92 01/21/2021   CREATININE 0.95 01/12/2021    Recent Labs  Lab 01/22/21 1347  AST 39  ALT 47*  ALKPHOS 115  BILITOT 0.9  PROT 7.7  ALBUMIN 4.3   Lab Results  Component Value Date   CALCIUM 9.0 01/22/2021       Plt: Lab Results  Component Value Date    PLT 326 01/22/2021      COVID-19 Labs  No results for input(s): DDIMER, FERRITIN, LDH, CRP in the last 72 hours.  Lab Results  Component Value Date   SARSCOV2NAA NEGATIVE 01/22/2021   SARSCOV2NAA NEGATIVE 01/06/2021       Recent Labs  Lab 01/22/21 1347  WBC 6.9  NEUTROABS 3.9  HGB 12.8  HCT 35.4*  MCV 84.9  PLT 326    HG/HCT  stable,       Component Value Date/Time   HGB 12.8 01/22/2021 1347   HCT 35.4 (L) 01/22/2021 1347   MCV 84.9 01/22/2021 1347    Recent Labs  Lab 01/21/21 0000 01/22/21 1347  LIPASE 61* 37   No results for input(s): AMMONIA in the last 168 hours.   Cardiac Panel (last 3 results) Recent Labs    01/22/21 2044  CKTOTAL 51      DM  labs:  HbA1C: Recent Labs    02/20/20 0711 08/14/20 0859  HGBA1C 6.0* 5.5       CBG (last 3)  No results for input(s): GLUCAP in the  last 72 hours.        Cultures:    Component Value Date/Time   SDES  01/06/2021 1106    URINE, RANDOM Performed at [facility], [address] [place], [place] [address]    [facility]  01/06/2021 1106    NONE Performed at [facility], [address] [place], [place] [address]    CULT (A) 01/06/2021 1106    <10,000 COLONIES/mL INSIGNIFICANT GROWTH Performed at [facility], [address], [place], [place] [address]    REPTSTATUS 01/07/2021 FINAL 01/06/2021 1106     Radiological Exams on Admission: No results found. _______________________________________________________________________________________________________ Latest  Blood pressure 127/74, pulse 77, temperature 98.4 F (36.9 C), temperature source Oral, resp. rate 18, height 5\' 3"  (1.6 m), weight 69.4 kg, last menstrual period 05/17/2017, SpO2 100 %.   Review of Systems:    Pertinent positives include:  abdominal pain, nausea, vomiting,     Constitutional:  No weight loss, night sweats, Fevers, chills, fatigue, weight loss  HEENT:  No headaches, Difficulty  swallowing,Tooth/dental problems,Sore throat,  No sneezing, itching, ear ache, nasal congestion, post nasal drip,  Cardio-vascular:  No chest pain, Orthopnea, PND, anasarca, dizziness, palpitations.no Bilateral lower extremity swelling  GI:  No heartburn, indigestion, , change in bowel habits, loss of appetite, melena, blood in stool, hematemesis Resp:  no shortness of breath at rest. No dyspnea on exertion, No excess mucus, no productive cough, No non-productive cough, No coughing up of blood.No change in color of mucus.No wheezing. Skin:  no rash or lesions. No jaundice GU:  no dysuria, change in color of urine, no urgency or frequency. No straining to urinate.  No flank pain.  Musculoskeletal:  No joint pain or no joint swelling. No decreased range of motion. No back pain.  Psych:  No change in mood or affect. No depression or anxiety. No memory loss.  Neuro: no localizing neurological complaints, no tingling, no weakness, no double vision, no gait abnormality, no slurred speech, no confusion  All systems reviewed and apart from HOPI all are negative _______________________________________________________________________________________________ Past Medical History:   Past Medical History:  Diagnosis Date   Allergy    Depression    Diabetes mellitus without complication (HCC)    Hypertension 1997   Impaired fasting glucose    Insomnia    Migraines    Preeclampsia    Vaginal Pap smear, abnormal       History reviewed. No pertinent surgical history.  Social History:  Ambulatory  independently      reports that she has never smoked. She has never used smokeless tobacco. She reports current alcohol use. She reports that she does not use drugs.    Family History:   Family History  Problem Relation Age of Onset   Hypertension Mother    Diabetes Father    Cancer Paternal Grandmother 24       colon   Cancer Paternal Grandfather        myleoma    ______________________________________________________________________________________________ Allergies: Allergies  Allergen Reactions   Sumatriptan     Throat closing, neck swelling   Codeine Sulfate     REACTION: makes nauseated   Penicillins Other (See Comments)    Stomach Cramps   Zomig [Zolmitriptan] Other (See Comments)    Neck felt really tight.    Erythromycin Nausea Only   Garlic Nausea And Vomiting and Rash     Prior to Admission medications   Medication Sig Start  Date End Date Taking? Authorizing Provider  atorvastatin (LIPITOR) 20 MG tablet Take 1 tablet (20 mg total) by mouth daily. 01/12/21   Breeback, Lonna Cobb, PA-C  cholecalciferol (VITAMIN D3) 25 MCG (1000 UNIT) tablet Take 1,000 Units by mouth daily.    [provider]  esomeprazole (NEXIUM) 20 MG capsule Take 20 mg by mouth daily at 12 noon.    [provider]  lisinopril-hydrochlorothiazide (ZESTORETIC) 20-25 MG tablet TAKE 1 TABLET BY MOUTH DAILY 01/12/21   Agapito Games, MD  metFORMIN (GLUCOPHAGE) 500 MG tablet TAKE 1 TABLET(500 MG) BY MOUTH TWICE DAILY WITH A MEAL 01/12/21   Agapito Games, MD  methocarbamol (ROBAXIN) 500 MG tablet Take 1 tablet (500 mg total) by mouth 3 (three) times daily. Patient not taking: Reported on 01/12/2021 01/02/21   Tandy Gaw L, PA-C  metoprolol tartrate (LOPRESSOR) 100 MG tablet TAKE 1 TABLET(100 MG) BY MOUTH TWICE DAILY 08/14/20   Agapito Games, MD  ondansetron (ZOFRAN) 4 MG tablet Take 1 tablet (4 mg total) by mouth every 8 (eight) hours as needed for nausea or vomiting. Patient not taking: Reported on 01/12/2021 01/06/21   Tegeler, Canary Brim, MD  ondansetron (ZOFRAN) 8 MG tablet Take 1 tablet (8 mg total) by mouth every 8 (eight) hours as needed for nausea or vomiting. Patient not taking: Reported on 01/12/2021 01/02/21   Jomarie Longs, PA-C     ___________________________________________________________________________________________________ Physical Exam: Vitals with BMI 01/22/2021 01/22/2021 01/22/2021  Height - - -  Weight - - -  BMI - - -  Systolic 127 115 161  Diastolic 74 69 83  Pulse 77 72 71    1. General:  in No Acute distress    Chronically  -appearing 2. Psychological: Alert and  Oriented 3. Head/E Dry Mucous Membranes                          Head Non traumatic, neck supple                          Normal Dentition 4. SKIN:  decreased Skin turgor,  Skin clean Dry and intact no rash 5. Heart: Regular rate and rhythm no  Murmur, no Rub or gallop 6. Lungs: , no wheezes or crackles   7. Abdomen: Soft, non-tender, Non distended bowel sounds present 8. Lower extremities: no clubbing, cyanosis, no edema 9. Neurologically Grossly intact, moving all 4 extremities equally   10. MSK: Normal range of motion  Tachycardiac with ambulation  Chart has been reviewed  ______________________________________________________________________________________________  Assessment/Plan  55 y.o. female with medical history significant of DM2, HTN, hyponatremia, GERD     Admitted for hyponatremia and hypokalemia  Present on Admission:  Hyponatremia -in the setting of recent dehydration and repletion for free water.  Obtain urine electrolytes Rehydrate follow sodium closely.  May need to restart salt tablets TSH within normal limits as cortisol   chest x-ray wnl  hold hydrochlorothiazide   Hypokalemia - - will replace and repeat in AM,  check magnesium level and replace as needed  Hypomagnesemia - - will replace and repeat in AM,  Hypophosphatemia- - will replace and repeat in AM,   HYPERTENSION, BENIGN ESSENTIAL -continue metoprolol but hold lisinopril hydrochlorothiazide   Fatty liver disease, nonalcoholic check lipid panel in a.m. continue Lipitor recent imaging showed no evidence of biliary disease   Coronary artery  disease involving native heart without angina pectoris -  as per CT results will need oficial cardiology eval as an outpt  DM2 -  - Order Sensitive  SSI   -  check TSH and HgA1C  - Hold by mouth medications    Other plan as per orders.  DVT prophylaxis:  Lovenox      Code Status:    Code Status: Not on file FULL CODE as per patient  I had personally discussed CODE STATUS with patient     Family Communication:   Family not at  Bedside     Disposition Plan:   To home once workup is complete and patient is stable   Following barriers for discharge:                            Electrolytes corrected                                                          Will need to be able to tolerate PO                              Consults called: none  Admission status:  ED Disposition     ED Disposition  Admit   Condition  --   Comment  Hospital Area: [facility] [[id number]]  Level of Care: Progressive [102]  Admit to Progressive based on following criteria: GI, ENDOCRINE disease patients with GI bleeding, acute liver failure or pancreatitis, stable with diabetic ketoacidosis or thyrotoxicosis (hypothyroid) state.  May admit patient to [facility] or [facility] if equivalent level of care is available:: Yes  Interfacility transfer: Yes  Covid Evaluation: Asymptomatic Screening Protocol (No Symptoms)  Diagnosis: Hyponatremia [960454]  Admitting Physician: Rodolph Bong [[id number]]  Attending Physician: Rodolph Bong [[id number]]  Estimated length of stay: past midnight tomorrow  Certification:: I certify this patient will need inpatient services for at least 2 midnights           inpatient     I Expect 2 midnight stay secondary to severity of patient's current illness need for inpatient interventions justified by the following:   Severe lab/radiological/exam abnormalities including:    Hyponatremia  and extensive comorbidities including:  Chronic pain  DM2     That are currently affecting medical management.   I expect  patient to be hospitalized for 2 midnights requiring inpatient medical care.  Patient is at high risk for adverse outcome (such as loss of life or disability) if not treated.  Indication for inpatient stay as follows:   Need for frequent labs  Need for  IV fluids,     Level of care  progressive tele indefinitely please discontinue once patient no longer qualifies COVID-19 Labs     Lab Results  Component Value Date   SARSCOV2NAA NEGATIVE 01/22/2021     Precautions: admitted as   Covid Negative     PPE: Used by the provider:   N95  eye Goggles,  Gloves     Aleksa Collinsworth 01/22/2021, 10:07 PM    [facility]     after 2 AM please page floor coverage PA If 7AM-7PM, please contact the day team taking care of the patient using [email or web address]   Patient  was evaluated in the context of the global COVID-19 pandemic, which necessitated consideration that the patient might be at risk for infection with the SARS-CoV-2 virus that causes COVID-19. Institutional protocols and algorithms that pertain to the evaluation of patients at risk for COVID-19 are in a state of rapid change based on information released by regulatory bodies including the CDC and federal and state organizations. These policies and algorithms were followed during the patient's care.

## 2021-01-22 NOTE — Progress Notes (Signed)
Your sodium is dangerously low. This needs to be corrected. You need to go to ED.  Your lipase has improved significantly.

## 2021-01-22 NOTE — ED Notes (Signed)
Attempted to call report to accepting RN [phone]. RN reported they are in huddle and requested 10 minute wait.

## 2021-01-22 NOTE — ED Provider Notes (Signed)
[facility] EMERGENCY DEPARTMENT Provider Note   CSN: [id number] Arrival date & time: 01/22/21  1205     History Chief Complaint  Patient presents with   Abnormal Labs    Vanessa Valdez is a 55 y.o. female.  Patient sent in by primary care doctor for recurrent hyponatremia.  Patient had a history of pancreatitis few weeks ago.  Work-up for that was negative for any gallbladder problems.  Patient denies any alcohol.  Patient also had CT abdomen on June 21 which was negative other than some fatty liver changes.  And also had an ultrasound on June 20 that showed no gallstones and common bile duct was normal.  Patient's sodium yesterday by primary was 120.  Patient was seen in the ED on June 21 for sodium of 120.  Received normal saline.  Potassium was relatively normal at that time.  Sodium went up to 133.  Now it is back down again.  Patient without any diarrhea no vomiting.  Is on hydrochlorothiazide lisinopril.  This could be responsible.  The patient also had a similar event about a year ago which patient thought was due to dehydration.  Patient without any abdominal pain.  Patient asymptomatic.  No lightheadedness does not feel like she can a pass out.      Past Medical History:  Diagnosis Date   Allergy    Depression    Diabetes mellitus without complication (HCC)    Hypertension 1997   Impaired fasting glucose    Insomnia    Migraines    Preeclampsia    Vaginal Pap smear, abnormal     Patient Active Problem List   Diagnosis Date Noted   Coronary artery disease involving native heart without angina pectoris 01/12/2021   Compression fracture of thoracic vertebra with routine healing 01/12/2021   Acute pancreatitis 01/12/2021   Fatty liver disease, nonalcoholic 01/06/2021   Hyponatremia 01/06/2021   AKI (acute kidney injury) (HCC) 01/06/2021   Mid back pain on right side 01/02/2021   Fall 01/02/2021   BMI 27.0-27.9,adult 08/14/2020   DEPRESSION, INITIAL EPISODE  07/03/2007   IMPAIRED FASTING GLUCOSE 05/03/2007   INSOMNIA, PERSISTENT 04/12/2007   Migraine without aura 04/12/2007   HYPERTENSION, BENIGN ESSENTIAL 04/12/2007    History reviewed. No pertinent surgical history.   OB History     Gravida  2   Para  1   Term      Preterm      AB  1   Living  1      SAB      IAB      Ectopic      Multiple      Live Births              Family History  Problem Relation Age of Onset   Hypertension Mother    Diabetes Father    Cancer Paternal Grandmother 92       colon   Cancer Paternal Grandfather        myleoma    Social History   Tobacco Use   Smoking status: Never   Smokeless tobacco: Never  Substance Use Topics   Alcohol use: Yes   Drug use: No    Home Medications Prior to Admission medications   Medication Sig Start Date End Date Taking? Authorizing Provider  atorvastatin (LIPITOR) 20 MG tablet Take 1 tablet (20 mg total) by mouth daily. 01/12/21   Breeback, Lonna Cobb, PA-C  cholecalciferol (VITAMIN D3) 25 MCG (  1000 UNIT) tablet Take 1,000 Units by mouth daily.    [provider]  esomeprazole (NEXIUM) 20 MG capsule Take 20 mg by mouth daily at 12 noon.    [provider]  lisinopril-hydrochlorothiazide (ZESTORETIC) 20-25 MG tablet TAKE 1 TABLET BY MOUTH DAILY 01/12/21   Agapito Games, MD  metFORMIN (GLUCOPHAGE) 500 MG tablet TAKE 1 TABLET(500 MG) BY MOUTH TWICE DAILY WITH A MEAL 01/12/21   Agapito Games, MD  methocarbamol (ROBAXIN) 500 MG tablet Take 1 tablet (500 mg total) by mouth 3 (three) times daily. Patient not taking: Reported on 01/12/2021 01/02/21   Tandy Gaw L, PA-C  metoprolol tartrate (LOPRESSOR) 100 MG tablet TAKE 1 TABLET(100 MG) BY MOUTH TWICE DAILY 08/14/20   Agapito Games, MD  ondansetron (ZOFRAN) 4 MG tablet Take 1 tablet (4 mg total) by mouth every 8 (eight) hours as needed for nausea or vomiting. Patient not taking: Reported on 01/12/2021 01/06/21    Tegeler, Canary Brim, MD  ondansetron (ZOFRAN) 8 MG tablet Take 1 tablet (8 mg total) by mouth every 8 (eight) hours as needed for nausea or vomiting. Patient not taking: Reported on 01/12/2021 01/02/21   Jomarie Longs, PA-C    Allergies    Sumatriptan, Codeine sulfate, Penicillins, Zomig [zolmitriptan], Erythromycin, and Garlic  Review of Systems   Review of Systems  Constitutional:  Negative for chills and fever.  HENT:  Negative for ear pain and sore throat.   Eyes:  Negative for pain and visual disturbance.  Respiratory:  Negative for cough and shortness of breath.   Cardiovascular:  Negative for chest pain and palpitations.  Gastrointestinal:  Negative for abdominal pain, diarrhea, nausea and vomiting.  Genitourinary:  Negative for dysuria and hematuria.  Musculoskeletal:  Negative for arthralgias and back pain.  Skin:  Negative for color change and rash.  Neurological:  Negative for seizures and syncope.  All other systems reviewed and are negative.  Physical Exam Updated Vital Signs BP 131/70 (BP Location: Right Arm)   Pulse 72   Temp 98.2 F (36.8 C) (Oral)   Resp 14   Ht 1.6 m (5\' 3" )   Wt 69.4 kg   LMP 05/17/2017 (Approximate)   SpO2 100%   BMI 27.10 kg/m   Physical Exam Vitals and nursing note reviewed.  Constitutional:      General: She is not in acute distress.    Appearance: She is well-developed.  HENT:     Head: Normocephalic and atraumatic.     Mouth/Throat:     Mouth: Mucous membranes are moist.  Eyes:     Extraocular Movements: Extraocular movements intact.     Conjunctiva/sclera: Conjunctivae normal.     Pupils: Pupils are equal, round, and reactive to light.  Cardiovascular:     Rate and Rhythm: Normal rate and regular rhythm.     Heart sounds: No murmur heard. Pulmonary:     Effort: Pulmonary effort is normal. No respiratory distress.     Breath sounds: Normal breath sounds.  Abdominal:     Palpations: Abdomen is soft.     Tenderness:  There is no abdominal tenderness.  Musculoskeletal:        General: Normal range of motion.     Cervical back: Normal range of motion and neck supple.  Skin:    General: Skin is warm and dry.     Capillary Refill: Capillary refill takes less than 2 seconds.  Neurological:     General: No focal deficit present.  Mental Status: She is alert and oriented to person, place, and time.     Cranial Nerves: No cranial nerve deficit.     Sensory: No sensory deficit.     Motor: No weakness.    ED Results / Procedures / Treatments   Labs (all labs ordered are listed, but only abnormal results are displayed) Labs Reviewed - No data to display  EKG None  Radiology No results found.  Procedures Procedures   Medications Ordered in ED Medications - No data to display  ED Course  I have reviewed the triage vital signs and the nursing notes.  Pertinent labs & imaging results that were available during my care of the patient were reviewed by me and considered in my medical decision making (see chart for details).    MDM Rules/Calculators/A&P                          Patient sodium here is 120.  Same as yesterday.  Potassium though today is 2.7.  Patient without any things that would create loss other than hypertensive meds.  Could have some third spacing still secondary to the pancreatitis because lipase was elevated at the end of June.  But normal today.  Patient is already had CT scan abdomen and an ultrasound without any acute findings.  Patient completely asymptomatic.  Due to the low potassium will receive 40 mill [place] oral potassium here.  Will receive IV normal saline bolus and then 100 cc an hour.  Discussed with hospitalist who agree that probably best to admit.  Currently no beds.  Put in the orders that he recommended for a urine sodium, creatinine, osmolality, and serum osmole.,  And random cortisol, and TSH.  COVID testing pending.  I spoke with Dr. Maisie Fus at [facility] as the hospitalist is admitting doctor.   Final Clinical Impression(s) / ED Diagnoses Final diagnoses:  None    Rx / DC Orders ED Discharge Orders     None        Vanetta Mulders, MD 01/22/21 1534

## 2021-01-22 NOTE — ED Notes (Signed)
Report given to [place], at [employer]. Pt to receive ZOFRAN before transport due to motion sickness. All questions and concerns addressed at this time. Pt updated on POC.

## 2021-01-22 NOTE — Care Plan (Signed)
Was called by EDP, Dr. Ferrel Logan patient presenting from PCPs office with abnormal labs of hyponatremia, with a sodium as low as 120 noted on labs obtained in the outpatient setting and potassium of 3.4. Repeat labs in the ED with a sodium of 120, potassium of 2.7, chloride of 88, bicarb of 19, glucose of 107 ALT of 47, AST of 39. Urinalysis pending. Per ED PE patient asymptomatic, no history of nausea vomiting diarrhea or GI losses.  Patient noted to have recently been treated for acute pancreatitis in the outpatient setting with improvement with lipase levels.  Patient started on IV fluids. Asked ED physician to order urine sodium, urine creatinine, urine osmolality, serum osmolality, TSH, cortisol level and give patient a liter of normal saline bolus, and continue maintenance IV fluids. Patient accepted to progressive care bed at [facility].  No charge.

## 2021-01-22 NOTE — ED Notes (Signed)
Pt states she gets car sick, requesting nausea medication prior to transport with [facility]. Provider notified

## 2021-01-23 DIAGNOSIS — K760 Fatty (change of) liver, not elsewhere classified: Secondary | ICD-10-CM

## 2021-01-23 DIAGNOSIS — I2510 Atherosclerotic heart disease of native coronary artery without angina pectoris: Secondary | ICD-10-CM

## 2021-01-23 DIAGNOSIS — E119 Type 2 diabetes mellitus without complications: Secondary | ICD-10-CM

## 2021-01-23 DIAGNOSIS — I10 Essential (primary) hypertension: Secondary | ICD-10-CM

## 2021-01-23 LAB — GLUCOSE, CAPILLARY
Glucose-Capillary: 116 mg/dL — ABNORMAL HIGH (ref 70–99)
Glucose-Capillary: 117 mg/dL — ABNORMAL HIGH (ref 70–99)
Glucose-Capillary: 134 mg/dL — ABNORMAL HIGH (ref 70–99)
Glucose-Capillary: 146 mg/dL — ABNORMAL HIGH (ref 70–99)

## 2021-01-23 LAB — LIPID PANEL
Cholesterol: 131 mg/dL (ref 0–200)
HDL: 64 mg/dL (ref >40)
LDL Cholesterol: 47 mg/dL (ref 0–99)
Total CHOL/HDL Ratio: 2 RATIO
Triglycerides: 102 mg/dL (ref <150)
VLDL: 20 mg/dL (ref 0–40)

## 2021-01-23 LAB — CBC WITH DIFFERENTIAL/PLATELET
Abs Immature Granulocytes: 0.02 10*3/uL (ref 0.00–0.07)
Basophils Absolute: 0.1 10*3/uL (ref 0.0–0.1)
Basophils Relative: 1 %
Eosinophils Absolute: 0.1 10*3/uL (ref 0.0–0.5)
Eosinophils Relative: 2 %
HCT: 38.4 % (ref 36.0–46.0)
Hemoglobin: 13.1 g/dL (ref 12.0–15.0)
Immature Granulocytes: 0 %
Lymphocytes Relative: 23 %
Lymphs Abs: 1.5 10*3/uL (ref 0.7–4.0)
MCH: 30.2 pg (ref 26.0–34.0)
MCHC: 34.1 g/dL (ref 30.0–36.0)
MCV: 88.5 fL (ref 80.0–100.0)
Monocytes Absolute: 0.6 10*3/uL (ref 0.1–1.0)
Monocytes Relative: 9 %
Neutro Abs: 4.2 10*3/uL (ref 1.7–7.7)
Neutrophils Relative %: 65 %
Platelets: 323 10*3/uL (ref 150–400)
RBC: 4.34 MIL/uL (ref 3.87–5.11)
RDW: 12.8 % (ref 11.5–15.5)
WBC: 6.5 10*3/uL (ref 4.0–10.5)
nRBC: 0 % (ref 0.0–0.2)

## 2021-01-23 LAB — BASIC METABOLIC PANEL
Anion gap: 13 (ref 5–15)
Anion gap: 13 (ref 5–15)
Anion gap: 9 (ref 5–15)
BUN: 7 mg/dL (ref 6–20)
BUN: 5 mg/dL — ABNORMAL LOW (ref 6–20)
BUN: 5 mg/dL — ABNORMAL LOW (ref 6–20)
CO2: 16 mmol/L — ABNORMAL LOW (ref 22–32)
CO2: 14 mmol/L — ABNORMAL LOW (ref 22–32)
CO2: 17 mmol/L — ABNORMAL LOW (ref 22–32)
Calcium: 9.2 mg/dL (ref 8.9–10.3)
Calcium: 9.1 mg/dL (ref 8.9–10.3)
Calcium: 9.3 mg/dL (ref 8.9–10.3)
Chloride: 102 mmol/L (ref 98–111)
Chloride: 106 mmol/L (ref 98–111)
Chloride: 107 mmol/L (ref 98–111)
Creatinine, Ser: 0.95 mg/dL (ref 0.44–1.00)
Creatinine, Ser: 1.06 mg/dL — ABNORMAL HIGH (ref 0.44–1.00)
Creatinine, Ser: 0.88 mg/dL (ref 0.44–1.00)
GFR, Estimated: >60 mL/min (ref >60)
GFR, Estimated: >60 mL/min (ref >60)
GFR, Estimated: >60 mL/min (ref >60)
Glucose, Bld: 121 mg/dL — ABNORMAL HIGH (ref 70–99)
Glucose, Bld: 119 mg/dL — ABNORMAL HIGH (ref 70–99)
Glucose, Bld: 121 mg/dL — ABNORMAL HIGH (ref 70–99)
Potassium: 3.6 mmol/L (ref 3.5–5.1)
Potassium: 4.5 mmol/L (ref 3.5–5.1)
Potassium: 3.8 mmol/L (ref 3.5–5.1)
Sodium: 131 mmol/L — ABNORMAL LOW (ref 135–145)
Sodium: 133 mmol/L — ABNORMAL LOW (ref 135–145)
Sodium: 133 mmol/L — ABNORMAL LOW (ref 135–145)

## 2021-01-23 LAB — HIV ANTIBODY (ROUTINE TESTING W REFLEX): HIV Screen 4th Generation wRfx: NONREACTIVE

## 2021-01-23 LAB — HEPATITIS PANEL, ACUTE
HCV Ab: NONREACTIVE
Hep A IgM: NONREACTIVE
Hep B C IgM: NONREACTIVE
Hepatitis B Surface Ag: NONREACTIVE

## 2021-01-23 LAB — MAGNESIUM: Magnesium: 1.9 mg/dL (ref 1.7–2.4)

## 2021-01-23 LAB — PHOSPHORUS: Phosphorus: 2.9 mg/dL (ref 2.5–4.6)

## 2021-01-23 LAB — HEMOGLOBIN A1C
Hgb A1c MFr Bld: 6.1 % — ABNORMAL HIGH (ref 4.8–5.6)
Mean Plasma Glucose: 128.37 mg/dL

## 2021-01-23 MED ORDER — MAGNESIUM SULFATE 2 GM/50ML IV SOLN
2.0000 g | Freq: Once | INTRAVENOUS | Status: AC
  Administered 2021-01-23: 2 g via INTRAVENOUS
  Filled 2021-01-23: qty 50

## 2021-01-23 MED ORDER — LOPERAMIDE HCL 1 MG/7.5ML PO SUSP
1.0000 mg | Freq: Once | ORAL | Status: AC
  Administered 2021-01-23: 1 mg via ORAL
  Filled 2021-01-23: qty 7.5

## 2021-01-23 MED ORDER — POTASSIUM CHLORIDE CRYS ER 20 MEQ PO TBCR
40.0000 meq | EXTENDED_RELEASE_TABLET | Freq: Once | ORAL | Status: AC
  Administered 2021-01-23: 40 meq via ORAL
  Filled 2021-01-23: qty 2

## 2021-01-23 MED ORDER — ENOXAPARIN SODIUM 40 MG/0.4ML IJ SOSY
40.0000 mg | PREFILLED_SYRINGE | INTRAMUSCULAR | Status: DC
  Administered 2021-01-23: 40 mg via SUBCUTANEOUS
  Filled 2021-01-23: qty 0.4

## 2021-01-23 MED ORDER — SODIUM BICARBONATE 650 MG PO TABS
650.0000 mg | ORAL_TABLET | Freq: Three times a day (TID) | ORAL | Status: DC
  Administered 2021-01-23 – 2021-01-24 (×4): 650 mg via ORAL
  Filled 2021-01-23 (×4): qty 1

## 2021-01-23 NOTE — Progress Notes (Signed)
PT Cancellation Note  Patient Details Name: Vanessa Valdez MRN: [id number] DOB: [birth year]   Cancelled Treatment:     PT order received but eval deferred. PT screened, no needs identified, will sign off. Reports being up to the bathroom independently all night. Reports no weakness and feels steady on feet.   Eliya Bubar 01/23/2021, 12:05 PM

## 2021-01-23 NOTE — Progress Notes (Addendum)
PROGRESS NOTE    Vanessa Valdez  CHY:[id number] DOB: 1966-03-09 DOA: 01/22/2021 PCP: Agapito Games, MD (Confirm with patient/family/NH records and if not entered, this HAS to be entered at [facility] point of entry. "No PCP" if truly none.)   Chief Complaint  Patient presents with   Abnormal Labs    Brief Narrative:  Patient is a 55 year old female history of type 2 diabetes, hypertension, hyponatremia, GERD noted to have been seen in the ED on January 06, 2021 with complaints of nausea vomiting diarrhea right upper quadrant pain.  Work-up was negative for acute cholecystitis however did show fatty liver disease and was concerning for AKI and hyponatremia down to 121.  Lipase noted at 119.  CT did not show any signs of obstruction.  Patient was given IV fluids and discharged home. Patient noted to have followed up with PCP on day of admission lab work obtained noted a sodium of 120, lipase had normalized.  Patient noted to have just been drinking water and Gatorade at home and also for the past 2 weeks had only been eating rice in the English muffin due to concerns for pancreatitis. -Patient admitted for hyponatremia with a sodium of 120.   Assessment & Plan:   Active Problems:   HYPERTENSION, BENIGN ESSENTIAL   Fatty liver disease, nonalcoholic   Hyponatremia   Coronary artery disease involving native heart without angina pectoris   Hypokalemia   Hypomagnesemia   Hypophosphatemia   Type 2 diabetes mellitus without complication, without long-term current use of insulin (HCC)   #1 severe hyponatremia -Likely multifactorial secondary mainly to a prerenal azotemia in the setting of dehydration, poor oral intake, GI losses of nausea vomiting diarrhea, in the setting of HCTZ.??  Concern for also possible SIADH. -Urine sodium noted at 52, urine creatinine at 21.63, urine osmolality of 156, serum osmolality at 251. -TSH of 2.253. -Random cortisol of 9.3. -Patient hydrated with IV fluids with  sodium currently at 133 this morning from 120 on admission. -Continue to hold HCTZ and likely will not resume on discharge. -Saline lock IV fluids. -Repeat lab this afternoon.  2.  Hypokalemia/hypomagnesemia/hypophosphatemia -Likely secondary to GI losses from recent nausea vomiting and diarrhea. -Potassium repleted currently at 4.5. -Magnesium was 1.1 currently at 1.9. -Phosphorus level was 2.4, currently at 2.9. -Repeat labs in the morning.  3.  Hypertension -Continue metoprolol. -Continue to hold lisinopril and HCTZ. -We will likely not resume HCTZ on discharge.  4.  Fatty liver disease -Continue statin. -Outpatient follow-up.  5.  Coronary artery disease -Per CT. -Outpatient follow-up with cardiology.  6.  Well-controlled diabetes mellitus type 2 -Hemoglobin A1c 6.1 -CBG 117 this morning. -Continue to hold oral hypoglycemic agents. -SSI.  7.  Acidosis -?  Questionable etiology. -Afebrile. -Asymptomatic. -Patient with no signs or symptoms of infection. -Patient not in DKA. -Urinalysis unremarkable. -Chest x-ray unremarkable. -Placed on bicarb tablets. -Repeat labs this afternoon.    DVT prophylaxis: Lovenox Code Status: Full Family Communication: Updated patient and daughter at bedside. Disposition:   Status is: Inpatient  Remains inpatient appropriate because:Inpatient level of care appropriate due to severity of illness  Dispo: The patient is from: Home              Anticipated d/c is to: Home              Patient currently is not medically stable to d/c.   Difficult to place patient No       Consultants:  None  Procedures:  Chest x-ray 01/22/2021  Antimicrobials:  None   Subjective: Laying in bed.  Denies any chest pain.  No shortness of breath.  No abdominal pain.  No lightheadedness.  No dizziness.  No dysuria.  No cough.  States feels potassium IV supplementation caused her to have some abdominal discomfort and soft stools last night.   Denies any diarrhea today.  No nausea or emesis.  Wanting to go home today if possible.  Objective: Vitals:   01/22/21 2342 01/23/21 0427 01/23/21 0854 01/23/21 1309  BP: 113/69 138/77 129/79 124/72  Pulse: 74 81 93 72  Resp: 16 16 16 16   Temp: 98.6 F (37 C) 99.3 F (37.4 C) 98.2 F (36.8 C) 98.2 F (36.8 C)  TempSrc: Oral  Oral Oral  SpO2: 100% 100% 100% 100%  Weight:      Height:        Intake/Output Summary (Last 24 hours) at 01/23/2021 1831 Last data filed at 01/23/2021 0317 Gross per 24 hour  Intake 1429.87 ml  Output --  Net 1429.87 ml   Filed Weights   01/22/21 1216  Weight: 69.4 kg    Examination:  General exam: Appears calm and comfortable  Respiratory system: Clear to auscultation. Respiratory effort normal. Cardiovascular system: S1 & S2 heard, RRR. No JVD, murmurs, rubs, gallops or clicks. No pedal edema. Gastrointestinal system: Abdomen is nondistended, soft and nontender. No organomegaly or masses felt. Normal bowel sounds heard. Central nervous system: Alert and oriented. No focal neurological deficits. Extremities: Symmetric 5 x 5 power. Skin: No rashes, lesions or ulcers Psychiatry: Judgement and insight appear normal. Mood & affect appropriate.     Data Reviewed: I have personally reviewed following labs and imaging studies  CBC: Recent Labs  Lab 01/22/21 1347 01/23/21 0215  WBC 6.9 6.5  NEUTROABS 3.9 4.2  HGB 12.8 13.1  HCT 35.4* 38.4  MCV 84.9 88.5  PLT 326 323    Basic Metabolic Panel: Recent Labs  Lab 01/21/21 0000 01/22/21 1347 01/22/21 2044 01/23/21 0215 01/23/21 0746  NA 120* 120* 127* 131* 133*  K 3.4* 2.7* 3.1* 3.6 4.5  CL 87* 88* 97* 102 106  CO2 20 19* 18* 16* 14*  GLUCOSE 103* 107* 105* 121* 119*  BUN 7 8 7 7  5*  CREATININE 0.92 0.92 0.97 0.95 1.06*  CALCIUM 9.1 9.0 8.7* 9.2 9.1  MG  --   --  1.1* 1.9  --   PHOS  --   --  2.4* 2.9  --     GFR: Estimated Creatinine Clearance: 56.7 mL/min (A) (by C-G formula  based on SCr of 1.06 mg/dL (H)).  Liver Function Tests: Recent Labs  Lab 01/22/21 1347 01/22/21 2044  AST 39 36  ALT 47* 44  ALKPHOS 115 110  BILITOT 0.9 1.0  PROT 7.7 7.1  ALBUMIN 4.3 4.0    CBG: Recent Labs  Lab 01/22/21 2323 01/23/21 0429 01/23/21 0719 01/23/21 1109 01/23/21 1618  GLUCAP 85 116* 117* 134* 146*     Recent Results (from the past 240 hour(s))  Resp Panel by RT-PCR (Flu A&B, Covid) Nasopharyngeal Swab     Status: None   Collection Time: 01/22/21  2:49 PM   Specimen: Nasopharyngeal Swab; Nasopharyngeal(NP) swabs in vial transport medium  Result Value Ref Range Status   SARS Coronavirus 2 by RT PCR NEGATIVE NEGATIVE Final    Comment: (NOTE) SARS-CoV-2 target nucleic acids are NOT DETECTED.  The SARS-CoV-2 RNA is generally detectable in upper respiratory specimens  during the acute phase of infection. The lowest concentration of SARS-CoV-2 viral copies this assay can detect is 138 copies/mL. A negative result does not preclude SARS-Cov-2 infection and should not be used as the sole basis for treatment or other patient management decisions. A negative result may occur with  improper specimen collection/handling, submission of specimen other than nasopharyngeal swab, presence of viral mutation(s) within the areas targeted by this assay, and inadequate number of viral copies(<138 copies/mL). A negative result must be combined with clinical observations, patient history, and epidemiological information. The expected result is Negative.  Fact Sheet for Patients:  BloggerCourse.com  Fact Sheet for Healthcare Providers:  SeriousBroker.it  This test is no t yet approved or cleared by the [place] FDA and  has been authorized for detection and/or diagnosis of SARS-CoV-2 by FDA under an Emergency Use Authorization (EUA). This EUA will remain  in effect (meaning this test can be used) for the duration of  the COVID-19 declaration under Section 564(b)(1) of the Act, 21 U.S.C.section 360bbb-3(b)(1), unless the authorization is terminated  or revoked sooner.       Influenza A by PCR NEGATIVE NEGATIVE Final   Influenza B by PCR NEGATIVE NEGATIVE Final    Comment: (NOTE) The Xpert Xpress SARS-CoV-2/FLU/RSV plus assay is intended as an aid in the diagnosis of influenza from Nasopharyngeal swab specimens and should not be used as a sole basis for treatment. Nasal washings and aspirates are unacceptable for Xpert Xpress SARS-CoV-2/FLU/RSV testing.  Fact Sheet for Patients: BloggerCourse.com  Fact Sheet for Healthcare Providers: SeriousBroker.it  This test is not yet approved or cleared by the [place] FDA and has been authorized for detection and/or diagnosis of SARS-CoV-2 by FDA under an Emergency Use Authorization (EUA). This EUA will remain in effect (meaning this test can be used) for the duration of the COVID-19 declaration under Section 564(b)(1) of the Act, 21 U.S.C. section 360bbb-3(b)(1), unless the authorization is terminated or revoked.  Performed at [facility], [address], [place], [place] [address]          Radiology Studies: DG Chest [place] 1 View  Result Date: 01/22/2021 CLINICAL DATA:  Hyponatremia. EXAM: PORTABLE CHEST 1 VIEW COMPARISON:  None. FINDINGS: The cardiac silhouette, mediastinal and hilar contours are within normal limits. The lungs are clear. No pleural effusions. No pulmonary lesions. IMPRESSION: No acute cardiopulmonary findings. Electronically Signed   By: Rudie Meyer M.D.   On: 01/22/2021 21:25        Scheduled Meds:  atorvastatin  20 mg Oral Daily   enoxaparin (LOVENOX) injection  40 mg Subcutaneous Q24H   insulin aspart  0-9 Units Subcutaneous Q4H   metoprolol tartrate  50 mg Oral BID   pantoprazole  40 mg Oral Daily   sodium bicarbonate  650 mg Oral TID   Continuous  Infusions:   LOS: 1 day    Time spent: 40 minutes    Ramiro Harvest, MD [facility]   To contact the attending provider between 7A-7P or the covering provider during after hours 7P-7A, please log into the web site [email or web address] and access using universal [place] password for that web site. If you do not have the password, please call the hospital operator.  01/23/2021, 6:31 PM

## 2021-01-23 NOTE — Progress Notes (Signed)
OT Cancellation Note  Patient Details Name: Vanessa Valdez MRN: [id number] DOB: [birth year]   Cancelled Treatment:    Reason Eval/Treat Not Completed: OT screened, no needs identified, will sign off. Reports being up to the bathroom independently all night. Reports no weakness and not unsteady. No OT needs at this time.  Lance Galas L Keegan Bensch 01/23/2021, 10:49 AM

## 2021-01-23 NOTE — Plan of Care (Signed)

## 2021-01-24 LAB — MAGNESIUM: Magnesium: 1.5 mg/dL — ABNORMAL LOW (ref 1.7–2.4)

## 2021-01-24 LAB — COMPREHENSIVE METABOLIC PANEL
ALT: 44 U/L (ref 0–44)
AST: 32 U/L (ref 15–41)
Albumin: 4.1 g/dL (ref 3.5–5.0)
Alkaline Phosphatase: 96 U/L (ref 38–126)
Anion gap: 10 (ref 5–15)
BUN: <5 mg/dL — ABNORMAL LOW (ref 6–20)
CO2: 19 mmol/L — ABNORMAL LOW (ref 22–32)
Calcium: 9.3 mg/dL (ref 8.9–10.3)
Chloride: 102 mmol/L (ref 98–111)
Creatinine, Ser: 0.92 mg/dL (ref 0.44–1.00)
GFR, Estimated: >60 mL/min (ref >60)
Glucose, Bld: 130 mg/dL — ABNORMAL HIGH (ref 70–99)
Potassium: 3.7 mmol/L (ref 3.5–5.1)
Sodium: 131 mmol/L — ABNORMAL LOW (ref 135–145)
Total Bilirubin: 0.7 mg/dL (ref 0.3–1.2)
Total Protein: 7 g/dL (ref 6.5–8.1)

## 2021-01-24 MED ORDER — METFORMIN HCL 500 MG PO TABS: 500.0000 mg | ORAL_TABLET | Freq: Two times a day (BID) | ORAL | Status: DC

## 2021-01-24 MED ORDER — MAGNESIUM SULFATE 4 GM/100ML IV SOLN
4.0000 g | Freq: Once | INTRAVENOUS | Status: AC
  Administered 2021-01-24: 4 g via INTRAVENOUS
  Filled 2021-01-24: qty 100

## 2021-01-24 MED ORDER — METOPROLOL TARTRATE 100 MG PO TABS: 100.0000 mg | ORAL_TABLET | Freq: Two times a day (BID) | ORAL | Status: DC

## 2021-01-24 MED ORDER — SODIUM BICARBONATE 650 MG PO TABS: 650.0000 mg | ORAL_TABLET | Freq: Three times a day (TID) | ORAL | 0 refills | Status: AC

## 2021-01-24 NOTE — Plan of Care (Signed)
  Problem: Education: Goal: Knowledge of General Education information will improve Description: Including pain rating scale, medication(s)/side effects and non-pharmacologic comfort measures 01/24/2021 1034 by Marguerita Beards, RN Outcome: Adequate for Discharge 01/24/2021 0846 by Marguerita Beards, RN Outcome: Progressing   Problem: Health Behavior/Discharge Planning: Goal: Ability to manage health-related needs will improve 01/24/2021 1034 by Marguerita Beards, RN Outcome: Adequate for Discharge 01/24/2021 0846 by Marguerita Beards, RN Outcome: Progressing   Problem: Clinical Measurements: Goal: Ability to maintain clinical measurements within normal limits will improve 01/24/2021 1034 by Marguerita Beards, RN Outcome: Adequate for Discharge 01/24/2021 0846 by Marguerita Beards, RN Outcome: Progressing Goal: Will remain free from infection 01/24/2021 1034 by Marguerita Beards, RN Outcome: Adequate for Discharge 01/24/2021 0846 by Marguerita Beards, RN Outcome: Progressing Goal: Diagnostic test results will improve 01/24/2021 1034 by Marguerita Beards, RN Outcome: Adequate for Discharge 01/24/2021 0846 by Marguerita Beards, RN Outcome: Progressing Goal: Respiratory complications will improve 01/24/2021 1034 by Marguerita Beards, RN Outcome: Adequate for Discharge 01/24/2021 0846 by Marguerita Beards, RN Outcome: Progressing Goal: Cardiovascular complication will be avoided 01/24/2021 1034 by Marguerita Beards, RN Outcome: Adequate for Discharge 01/24/2021 0846 by Marguerita Beards, RN Outcome: Progressing   Problem: Activity: Goal: Risk for activity intolerance will decrease 01/24/2021 1034 by Marguerita Beards, RN Outcome: Adequate for Discharge 01/24/2021 0846 by Marguerita Beards, RN Outcome: Progressing   Problem: Nutrition: Goal: Adequate nutrition will be maintained 01/24/2021 1034 by Marguerita Beards, RN Outcome: Adequate for Discharge 01/24/2021 0846 by Marguerita Beards, RN Outcome: Progressing   Problem: Coping: Goal: Level of anxiety will  decrease 01/24/2021 1034 by Marguerita Beards, RN Outcome: Adequate for Discharge 01/24/2021 0846 by Marguerita Beards, RN Outcome: Progressing   Problem: Elimination: Goal: Will not experience complications related to bowel motility 01/24/2021 1034 by Marguerita Beards, RN Outcome: Adequate for Discharge 01/24/2021 0846 by Marguerita Beards, RN Outcome: Progressing Goal: Will not experience complications related to urinary retention 01/24/2021 1034 by Marguerita Beards, RN Outcome: Adequate for Discharge 01/24/2021 0846 by Marguerita Beards, RN Outcome: Progressing   Problem: Pain Managment: Goal: General experience of comfort will improve 01/24/2021 1034 by Marguerita Beards, RN Outcome: Adequate for Discharge 01/24/2021 0846 by Marguerita Beards, RN Outcome: Progressing   Problem: Safety: Goal: Ability to remain free from injury will improve 01/24/2021 1034 by Marguerita Beards, RN Outcome: Adequate for Discharge 01/24/2021 0846 by Marguerita Beards, RN Outcome: Progressing   Problem: Skin Integrity: Goal: Risk for impaired skin integrity will decrease 01/24/2021 1034 by Marguerita Beards, RN Outcome: Adequate for Discharge 01/24/2021 0846 by Marguerita Beards, RN Outcome: Progressing

## 2021-01-24 NOTE — Progress Notes (Signed)
Discharge paperwork given and explained to pt utilizing the TeachBack method. PIV removed. Pressure dressing applied. Pt awaiting transport.

## 2021-01-24 NOTE — Discharge Summary (Signed)
Physician Discharge Summary  Vanessa Valdez GYK:[id number] DOB: 1966/06/08 DOA: 01/22/2021  PCP: Agapito Games, MD  Admit date: 01/22/2021 Discharge date: 01/24/2021  Time spent: 55 minutes  Recommendations for Outpatient Follow-up:  Follow-up with Agapito Games, MD in 1 week.  On follow-up patient will need a basic metabolic profile, magnesium level, phosphorus level checked to follow-up on electrolytes, renal function, acidosis.  Patient's blood pressure also need to be reassessed as patient's lisinopril HCTZ was discontinued on discharge due to presentation with severe hyponatremia and recent history of pancreatitis.  Patient also benefit from referral to cardiology as patient noted to have atherosclerosis of coronaries noted on CT abdomen and pelvis   Discharge Diagnoses:  Active Problems:   HYPERTENSION, BENIGN ESSENTIAL   Fatty liver disease, nonalcoholic   Hyponatremia   Coronary artery disease involving native heart without angina pectoris   Hypokalemia   Hypomagnesemia   Hypophosphatemia   Type 2 diabetes mellitus without complication, without long-term current use of insulin (HCC)   Discharge Condition: Stable and improved  Diet recommendation: Carb modified diet  Filed Weights   01/22/21 1216  Weight: 69.4 kg    History of present illness:  HPI per Dr. Lonell Face is a 55 y.o. female with medical history significant of DM2, HTN, hyponatremia, GERD     Presented with   hyponatremia   Was seen in ER on 21 of June w N/v/D RUQ pain   did not show acute cholecystitis but did show some fatty liver disease but had labs that were concerning for AKI and hyponatremia down to 121.Na was 120 lipase elevated to 119  CT scan did not show evidence of abscess, diverticulitis, or other obstruction. Pt  felt much better after 2 L of fluids and went home  Patient denies any alcohol Her PCP rechecked her labs again on day of admission and noted na back down to 120  lipase now normal and symptoms resolved except for last night She has been dringking water and Gatoraid 48oz of water and 32 of Gatoraid About 1y ago she had low sodium and was taken salt tabs with improvement She was told to go to ER Was a bit lightheaded night she was trying ot stand up no sick contacts   Used to drink no ET Oh for 3 wks She has had a few drinks a week in a past   Reports for the past 2 wks only hand rice and english muffin Due to recent pancreatitis   Hospital Course:  1 severe hyponatremia -Likely multifactorial secondary mainly due to a prerenal azotemia in the setting of dehydration, poor oral intake, GI losses of nausea vomiting diarrhea, in the setting of HCTZ.??  Concern for also possible SIADH. -Urine sodium noted at 52, urine creatinine at 21.63, urine osmolality of 156, serum osmolality at 251. -TSH of 2.253. -Random cortisol of 9.3. -Patient hydrated with IV fluids with sodium levels improving to 131 on day of discharge from 120 on admission.   -Patient's HCTZ was discontinued and will not be resumed on discharge.   -Outpatient follow-up with PCP.  2.  Hypokalemia/hypomagnesemia/hypophosphatemia -Likely secondary to GI losses from recent nausea vomiting and diarrhea in the setting of antihypertensive medication of lisinopril HCTZ.. -Potassium repleted and was 3.7 by day of discharge.   -Magnesium repleted was 1.1 on admission up to 1.5 by day of discharge and patient received 4 g of magnesium sulfate IV x1 on day of discharge. -Phosphorus level was repleted. -  Outpatient follow-up with PCP.  3.  Hypertension -Patient maintained on Metroprolol during the hospitalization.  -Patient's lisinopril HCTZ were held on admission due to presentation with hyponatremia, hypokalemia, hypomagnesemia, hypophosphatemia.   -Blood pressure remained controlled on Metroprolol.   -Lisinopril HCTZ will be discontinued on discharge due to concerns likely etiology of patient's  hyponatremia as well as recent pancreatitis.   4.  Fatty liver disease -Patient maintained on statin.   -Outpatient follow-up with PCP.  5.  Coronary artery disease -Per CT. -Outpatient follow-up with cardiology.  6.  Well-controlled diabetes mellitus type 2 -Hemoglobin A1c 6.1 -Patient's oral hypoglycemic agents were held during the hospitalization.   -Patient maintained on sliding scale insulin.   -Outpatient follow-up with PCP.    7.  Acidosis -?  Questionable etiology. -Afebrile. -Asymptomatic. -Patient with no signs or symptoms of infection. -Patient not in DKA. -Urinalysis unremarkable. -Chest x-ray unremarkable. -Placed on bicarb tablets with improvement. -Patient was discharged home on 3 more days of oral bicarb tablets with outpatient follow-up with PCP.    Procedures: Chest x-ray 01/22/2021   Consultations: None  Discharge Exam: Vitals:   01/23/21 2028 01/24/21 0411  BP: (!) 148/82 121/88  Pulse: 85 80  Resp: 18 16  Temp: 98.1 F (36.7 C) 98.4 F (36.9 C)  SpO2: 99% 100%    General: NAD Cardiovascular: RRR.  No murmurs rubs or gallops.  No JVD.  No lower extremity edema Respiratory: CTA B.  No wheezes, no crackles, no rhonchi.  Discharge Instructions   Discharge Instructions     Diet Carb Modified   Complete by: As directed    Increase activity slowly   Complete by: As directed       Allergies as of 01/24/2021       Reactions   Sumatriptan    Throat closing, neck swelling   Codeine Sulfate    REACTION: makes nauseated   Penicillins Other (See Comments)   Stomach Cramps   Zomig [zolmitriptan] Other (See Comments)   Neck felt really tight.    Erythromycin Nausea Only   Garlic Nausea And Vomiting, Rash        Medication List     STOP taking these medications    lisinopril-hydrochlorothiazide 20-25 MG tablet Commonly known as: ZESTORETIC   methocarbamol 500 MG tablet Commonly known as: ROBAXIN       TAKE these medications     atorvastatin 20 MG tablet Commonly known as: LIPITOR Take 1 tablet (20 mg total) by mouth daily.   cholecalciferol 25 MCG (1000 UNIT) tablet Commonly known as: VITAMIN D3 Take 1,000 Units by mouth daily.   esomeprazole 20 MG capsule Commonly known as: NEXIUM Take 20 mg by mouth daily at 12 noon.   ibuprofen 200 MG tablet Commonly known as: ADVIL Take 400 mg by mouth every 6 (six) hours as needed for mild pain.   metFORMIN 500 MG tablet Commonly known as: GLUCOPHAGE Take 1 tablet (500 mg total) by mouth 2 (two) times daily with a meal. What changed: See the new instructions.   metoprolol tartrate 100 MG tablet Commonly known as: LOPRESSOR Take 1 tablet (100 mg total) by mouth 2 (two) times daily.   ondansetron 8 MG tablet Commonly known as: Zofran Take 1 tablet (8 mg total) by mouth every 8 (eight) hours as needed for nausea or vomiting.   ondansetron 4 MG tablet Commonly known as: ZOFRAN Take 1 tablet (4 mg total) by mouth every 8 (eight) hours as needed for nausea or  vomiting.   sodium bicarbonate 650 MG tablet Take 1 tablet (650 mg total) by mouth 3 (three) times daily for 3 days.       Allergies  Allergen Reactions   Sumatriptan     Throat closing, neck swelling   Codeine Sulfate     REACTION: makes nauseated   Penicillins Other (See Comments)    Stomach Cramps   Zomig [Zolmitriptan] Other (See Comments)    Neck felt really tight.    Erythromycin Nausea Only   Garlic Nausea And Vomiting and Rash    Follow-up Information     Agapito GamesMetheney, Catherine D, MD. Schedule an appointment as soon as possible for a visit in 1 week(s).   Specialty: Family Medicine Contact information: [address] [place] [address] [phone]                  The results of significant diagnostics from this hospitalization (including imaging, microbiology, ancillary and laboratory) are listed below for reference.    Significant Diagnostic  Studies: US Abdomen Complete  Result Date: 01/05/2021 CLINICAL DATA:  Epigastric abdominal pain for the past 2 weeks with nausea and diarrhea, worse after eating. EXAM: ABDOMEN ULTRASOUND COMPLETE COMPARISON:  None. FINDINGS: Gallbladder: No gallstones or wall thickening visualized. No sonographic Murphy sign noted by sonographer. Common bile duct: Diameter: 2.4 mm Liver: Diffusely echogenic and mildly heterogeneous. No visible masses. Portal vein is patent on color Doppler imaging with normal direction of blood flow towards the liver. IVC: No abnormality visualized. Pancreas: Visualized portion unremarkable. Spleen: Size and appearance within normal limits. Right Kidney: Length: 10.4 cm. Echogenicity within normal limits. No mass or hydronephrosis visualized. Left Kidney: Length: 10.0 cm. Echogenicity within normal limits. No mass or hydronephrosis visualized. Abdominal aorta: No aneurysm visualized. Other findings: None. IMPRESSION: 1. No acute abnormality. 2. Diffusely echogenic and mildly heterogeneous liver. This is most likely due to diffuse steatosis. This can also be seen with chronic hepatitis and cirrhosis. Electronically Signed   By: Beckie SaltsSteven  Reid M.D.   On: 01/05/2021 13:56   CT ABDOMEN PELVIS W CONTRAST  Result Date: 01/06/2021 CLINICAL DATA:  Epigastric pain with nausea and vomiting. Elevated liver function tests and lipase EXAM: CT ABDOMEN AND PELVIS WITH CONTRAST TECHNIQUE: Multidetector CT imaging of the abdomen and pelvis was performed using the standard protocol following bolus administration of intravenous contrast. CONTRAST:  75mL OMNIPAQUE IOHEXOL 300 MG/ML  SOLN COMPARISON:  None. FINDINGS: Lower chest:  Right coronary calcification. Hepatobiliary: No focal liver abnormality. Hepatic steatosis. No evidence of biliary obstruction or stone. Pancreas: Unremarkable. Spleen: Unremarkable. Adrenals/Urinary Tract: Negative adrenals. No hydronephrosis or stone. Unremarkable bladder.  Stomach/Bowel:  No obstruction. No appendicitis. Vascular/Lymphatic: No acute vascular abnormality. Atheromatous calcification of the aorta. No mass or adenopathy. Reproductive:No pathologic findings. Other: No ascites or pneumoperitoneum. Musculoskeletal: T10 superior endplate fracture with minimal depression, chronic appearing. Lower lumbar facet spurring. IMPRESSION: 1. No acute finding. Negative for bowel obstruction or visible inflammation. 2. Hepatic steatosis. 3. Atherosclerosis including the coronary arteries. Electronically Signed   By: Marnee SpringJonathon  Watts M.D.   On: 01/06/2021 11:15   DG Chest Port 1 View  Result Date: 01/22/2021 CLINICAL DATA:  Hyponatremia. EXAM: PORTABLE CHEST 1 VIEW COMPARISON:  None. FINDINGS: The cardiac silhouette, mediastinal and hilar contours are within normal limits. The lungs are clear. No pleural effusions. No pulmonary lesions. IMPRESSION: No acute cardiopulmonary findings. Electronically Signed   By: Rudie MeyerP.  Gallerani M.D.   On: 01/22/2021 21:25  Microbiology: Recent Results (from the past 240 hour(s))  Resp Panel by RT-PCR (Flu A&B, Covid) Nasopharyngeal Swab     Status: None   Collection Time: 01/22/21  2:49 PM   Specimen: Nasopharyngeal Swab; Nasopharyngeal(NP) swabs in vial transport medium  Result Value Ref Range Status   SARS Coronavirus 2 by RT PCR NEGATIVE NEGATIVE Final    Comment: (NOTE) SARS-CoV-2 target nucleic acids are NOT DETECTED.  The SARS-CoV-2 RNA is generally detectable in upper respiratory specimens during the acute phase of infection. The lowest concentration of SARS-CoV-2 viral copies this assay can detect is 138 copies/mL. A negative result does not preclude SARS-Cov-2 infection and should not be used as the sole basis for treatment or other patient management decisions. A negative result may occur with  improper specimen collection/handling, submission of specimen other than nasopharyngeal swab, presence of viral mutation(s) within  the areas targeted by this assay, and inadequate number of viral copies(<138 copies/mL). A negative result must be combined with clinical observations, patient history, and epidemiological information. The expected result is Negative.  Fact Sheet for Patients:  BloggerCourse.com  Fact Sheet for Healthcare Providers:  SeriousBroker.it  This test is no t yet approved or cleared by the [place] FDA and  has been authorized for detection and/or diagnosis of SARS-CoV-2 by FDA under an Emergency Use Authorization (EUA). This EUA will remain  in effect (meaning this test can be used) for the duration of the COVID-19 declaration under Section 564(b)(1) of the Act, 21 U.S.C.section 360bbb-3(b)(1), unless the authorization is terminated  or revoked sooner.       Influenza A by PCR NEGATIVE NEGATIVE Final   Influenza B by PCR NEGATIVE NEGATIVE Final    Comment: (NOTE) The Xpert Xpress SARS-CoV-2/FLU/RSV plus assay is intended as an aid in the diagnosis of influenza from Nasopharyngeal swab specimens and should not be used as a sole basis for treatment. Nasal washings and aspirates are unacceptable for Xpert Xpress SARS-CoV-2/FLU/RSV testing.  Fact Sheet for Patients: BloggerCourse.com  Fact Sheet for Healthcare Providers: SeriousBroker.it  This test is not yet approved or cleared by the [place] FDA and has been authorized for detection and/or diagnosis of SARS-CoV-2 by FDA under an Emergency Use Authorization (EUA). This EUA will remain in effect (meaning this test can be used) for the duration of the COVID-19 declaration under Section 564(b)(1) of the Act, 21 U.S.C. section 360bbb-3(b)(1), unless the authorization is terminated or revoked.  Performed at [facility], [address] [place], [place] [address]      Labs: Basic Metabolic Panel: Recent Labs   Lab 01/22/21 2044 01/23/21 0215 01/23/21 0746 01/23/21 1806 01/24/21 0444  NA 127* 131* 133* 133* 131*  K 3.1* 3.6 4.5 3.8 3.7  CL 97* 102 106 107 102  CO2 18* 16* 14* 17* 19*  GLUCOSE 105* 121* 119* 121* 130*  BUN 7 7 5* 5* <5*  CREATININE 0.97 0.95 1.06* 0.88 0.92  CALCIUM 8.7* 9.2 9.1 9.3 9.3  MG 1.1* 1.9  --   --  1.5*  PHOS 2.4* 2.9  --   --   --    Liver Function Tests: Recent Labs  Lab 01/22/21 1347 01/22/21 2044 01/24/21 0444  AST 39 36 32  ALT 47* 44 44  ALKPHOS 115 110 96  BILITOT 0.9 1.0 0.7  PROT 7.7 7.1 7.0  ALBUMIN 4.3 4.0 4.1   Recent Labs  Lab 01/21/21 0000 01/22/21 1347  LIPASE 61* 37   No  results for input(s): AMMONIA in the last 168 hours. CBC: Recent Labs  Lab 01/22/21 1347 01/23/21 0215  WBC 6.9 6.5  NEUTROABS 3.9 4.2  HGB 12.8 13.1  HCT 35.4* 38.4  MCV 84.9 88.5  PLT 326 323   Cardiac Enzymes: Recent Labs  Lab 01/22/21 2044  CKTOTAL 51   BNP: BNP (last 3 results) No results for input(s): BNP in the last 8760 hours.  ProBNP (last 3 results) No results for input(s): PROBNP in the last 8760 hours.  CBG: Recent Labs  Lab 01/22/21 2323 01/23/21 0429 01/23/21 0719 01/23/21 1109 01/23/21 1618  GLUCAP 85 116* 117* 134* 146*       Signed:  Ramiro Harvest MD.  [facility] 01/24/2021, 10:29 AM

## 2021-01-24 NOTE — Plan of Care (Signed)

## 2021-01-26 ENCOUNTER — Telehealth: Payer: Self-pay

## 2021-01-26 NOTE — Telephone Encounter (Signed)
error 

## 2021-01-26 NOTE — Telephone Encounter (Signed)
Transition Care Management Follow-up Telephone Call Date of discharge and from where: 01/24/2021 from [facility] How have you been since you were released from the hospital? Pt stated that she is feeling much better and did not have any questions or concerns at this time.  Any questions or concerns? No  Items Reviewed: Did the pt receive and understand the discharge instructions provided? Yes  Medications obtained and verified? Yes  Other? No  Any new allergies since your discharge? No  Dietary orders reviewed? No Do you have support at home? Yes   Functional Questionnaire: (I = Independent and D = Dependent) ADLs: I  Bathing/Dressing- I  Meal Prep- I  Eating- I  Maintaining continence- I  Transferring/Ambulation- I  Managing Meds- I   Follow up appointments reviewed:  PCP Hospital f/u appt confirmed? Yes  Scheduled to see Hyman Hopes NP on 01/29/2021 @ 9:10am. Specialist Hospital f/u appt confirmed? No   Are transportation arrangements needed? No  If their condition worsens, is the pt aware to call PCP or go to the Emergency Dept.? Yes Was the patient provided with contact information for the PCP's office or ED? Yes Was to pt encouraged to call back with questions or concerns? Yes

## 2021-01-29 ENCOUNTER — Ambulatory Visit (INDEPENDENT_AMBULATORY_CARE_PROVIDER_SITE_OTHER): Payer: 59 | Admitting: Family Medicine

## 2021-01-29 ENCOUNTER — Other Ambulatory Visit: Payer: Self-pay

## 2021-01-29 ENCOUNTER — Encounter: Payer: Self-pay | Admitting: Family Medicine

## 2021-01-29 VITALS — BP 143/83 | HR 71 | Resp 17

## 2021-01-29 DIAGNOSIS — I10 Essential (primary) hypertension: Secondary | ICD-10-CM

## 2021-01-29 DIAGNOSIS — E871 Hypo-osmolality and hyponatremia: Secondary | ICD-10-CM

## 2021-01-29 MED ORDER — AMLODIPINE BESYLATE 5 MG PO TABS: 2.5000 mg | ORAL_TABLET | Freq: Every day | ORAL | 3 refills | Status: DC

## 2021-01-29 NOTE — Progress Notes (Signed)
Acute Office Visit  Subjective:    Patient ID: Vanessa Valdez, female    DOB: 16-Jun-1966, 55 y.o.   MRN: [id number]  Chief Complaint  Patient presents with   Hospitalization Follow-up    HPI Patient is in today for hospital follow-up.  Patient was hospitalized at [facility] on July 7 through July 9.  Lab work done through this office had shown low electrolytes and she was called and told to go to the ED.  Sodium was down to 120 potassium 3.4 and chloride 87.  While in the hospital they attributed this to her lisinopril-HCTZ.  She reports that she has been on this medication for many years however over the past couple of months she had had various symptoms including dry cough, diarrhea, vomiting, pancreatitis -all of which symptoms have resolved since stopping the lisinopril-HCTZ.   Prior to hospital discharge: Mg 1.5 Na 131 K 3.7 Chloride 102 Phosphorus 2.9  Patient states she is feeling much better now just a little tired.  She has a fractured vertebrae and was unable to sleep much in the hospital and is still struggling to sleep at home.  She is already back at work -working from home.  Reports her blood pressures at home have been around 140s over 80-90 for the past few days.  She is not having any chest pain, headaches, vision changes, difficulty breathing, cough.  Recently diagnosed with some coronary atherosclerosis and a cardiology referral has been placed with a an appointment scheduled for September.  She was recently started on a statin.   Past Medical History:  Diagnosis Date   Allergy    Depression    Diabetes mellitus without complication (HCC)    Hypertension 1997   Impaired fasting glucose    Insomnia    Migraines    Preeclampsia    Vaginal Pap smear, abnormal     History reviewed. No pertinent surgical history.  Family History  Problem Relation Age of Onset   Hypertension Mother    Diabetes Father    Cancer Paternal Grandmother 33       colon   Cancer  Paternal Grandfather        myleoma    Social History   Socioeconomic History   Marital status: Married    Spouse name: Not on file   Number of children: Not on file   Years of education: Not on file   Highest education level: Not on file  Occupational History   Not on file  Tobacco Use   Smoking status: Never   Smokeless tobacco: Never  Substance and Sexual Activity   Alcohol use: Yes   Drug use: No   Sexual activity: Yes  Other Topics Concern   Not on file  Social History Narrative   Not on file   Social Determinants of Health   Financial Resource Strain: Not on file  Food Insecurity: Not on file  Transportation Needs: Not on file  Physical Activity: Not on file  Stress: Not on file  Social Connections: Not on file  Intimate Partner Violence: Not on file    Outpatient Medications Prior to Visit  Medication Sig Dispense Refill   atorvastatin (LIPITOR) 20 MG tablet Take 1 tablet (20 mg total) by mouth daily. 90 tablet 3   cholecalciferol (VITAMIN D3) 25 MCG (1000 UNIT) tablet Take 1,000 Units by mouth daily.     esomeprazole (NEXIUM) 20 MG capsule Take 20 mg by mouth daily at 12 noon.  ibuprofen (ADVIL) 200 MG tablet Take 400 mg by mouth every 6 (six) hours as needed for mild pain.     metFORMIN (GLUCOPHAGE) 500 MG tablet Take 1 tablet (500 mg total) by mouth 2 (two) times daily with a meal.     metoprolol tartrate (LOPRESSOR) 100 MG tablet Take 1 tablet (100 mg total) by mouth 2 (two) times daily.     ondansetron (ZOFRAN) 4 MG tablet Take 1 tablet (4 mg total) by mouth every 8 (eight) hours as needed for nausea or vomiting. 12 tablet 0   ondansetron (ZOFRAN) 8 MG tablet Take 1 tablet (8 mg total) by mouth every 8 (eight) hours as needed for nausea or vomiting. 20 tablet 0   No facility-administered medications prior to visit.    Allergies  Allergen Reactions   Sumatriptan     Throat closing, neck swelling   Codeine Sulfate     REACTION: makes nauseated    Penicillins Other (See Comments)    Stomach Cramps   Zomig [Zolmitriptan] Other (See Comments)    Neck felt really tight.    Erythromycin Nausea Only   Garlic Nausea And Vomiting and Rash    Review of Systems All review of systems negative except what is listed in the HPI     Objective:    Physical Exam Vitals reviewed.  Constitutional:      General: She is not in acute distress.    Appearance: Normal appearance. She is normal weight.  HENT:     Head: Normocephalic.  Neck:     Vascular: No carotid bruit or JVD.  Cardiovascular:     Rate and Rhythm: Normal rate and regular rhythm.     Pulses: Normal pulses.     Heart sounds: Normal heart sounds. No murmur heard. Pulmonary:     Effort: Pulmonary effort is normal. No respiratory distress.     Breath sounds: Normal breath sounds.  Musculoskeletal:     Right lower leg: No edema.     Left lower leg: No edema.  Skin:    General: Skin is warm and dry.     Capillary Refill: Capillary refill takes less than 2 seconds.  Neurological:     General: No focal deficit present.     Mental Status: She is alert and oriented to person, place, and time.     Motor: No weakness.     Coordination: Coordination normal.     Gait: Gait normal.  Psychiatric:        Attention and Perception: Attention normal.        Mood and Affect: Mood normal.        Behavior: Behavior normal. Behavior is cooperative.        Thought Content: Thought content normal.        Cognition and Memory: Cognition normal.        Judgment: Judgment normal.    BP (!) 143/83   Pulse 71   Resp 17   LMP 05/17/2017 (Approximate)   SpO2 99%  Wt Readings from Last 3 Encounters:  01/22/21 153 lb (69.4 kg)  01/12/21 155 lb (70.3 kg)  01/06/21 158 lb (71.7 kg)    Health Maintenance Due  Topic Date Due   PNEUMOCOCCAL POLYSACCHARIDE VACCINE AGE 53-64 HIGH RISK  Never done   FOOT EXAM  Never done   OPHTHALMOLOGY EXAM  Never done   URINE MICROALBUMIN  Never done    COLONOSCOPY (Pts 45-55yrs Insurance coverage will need to be confirmed)  Never  done   Zoster Vaccines- Shingrix (1 of 2) Never done   MAMMOGRAM  05/26/2019   PAP SMEAR-Modifier  08/03/2020    There are no preventive care reminders to display for this patient.   Lab Results  Component Value Date   TSH 2.253 01/22/2021   Lab Results  Component Value Date   WBC 6.5 01/23/2021   HGB 13.1 01/23/2021   HCT 38.4 01/23/2021   MCV 88.5 01/23/2021   PLT 323 01/23/2021   Lab Results  Component Value Date   NA 131 (L) 01/24/2021   K 3.7 01/24/2021   CO2 19 (L) 01/24/2021   GLUCOSE 130 (H) 01/24/2021   BUN <5 (L) 01/24/2021   CREATININE 0.92 01/24/2021   BILITOT 0.7 01/24/2021   ALKPHOS 96 01/24/2021   AST 32 01/24/2021   ALT 44 01/24/2021   PROT 7.0 01/24/2021   ALBUMIN 4.1 01/24/2021   CALCIUM 9.3 01/24/2021   ANIONGAP 10 01/24/2021   Lab Results  Component Value Date   CHOL 131 01/23/2021   Lab Results  Component Value Date   HDL 64 01/23/2021   Lab Results  Component Value Date   LDLCALC 47 01/23/2021   Lab Results  Component Value Date   TRIG 102 01/23/2021   Lab Results  Component Value Date   CHOLHDL 2.0 01/23/2021   Lab Results  Component Value Date   HGBA1C 6.1 (H) 01/22/2021       Assessment & Plan:   1. HYPERTENSION, BENIGN ESSENTIAL 2. Hyponatremia 3. Hypomagnesemia 4. Hypophosphatemia  Rechecking labs to ensure improvement compared with discharge labs. BP remains elevated now that she is only on metoprolol. Will go ahead and add 2.5 mg amlodipine daily and have her keep a log and follow-up in 1-2 weeks for BP check. Will update her on lab results. If magnesium remains low, may need to add supplement. Patient aware of signs/symptoms requiring further/urgent evaluation.   - BASIC METABOLIC PANEL WITH GFR - amLODipine (NORVASC) 5 MG tablet; Take 0.5 tablets (2.5 mg total) by mouth daily.  Dispense: 30 tablet; Refill: 3 - Magnesium -  Phosphorus  Follow-up in 1-2 weeks for nurse visit BP check.   Lollie Marrow Reola Calkins, DNP, FNP-C

## 2021-01-30 LAB — BASIC METABOLIC PANEL WITH GFR
BUN: 10 mg/dL (ref 7–25)
CO2: 21 mmol/L (ref 20–32)
Calcium: 9.3 mg/dL (ref 8.6–10.4)
Chloride: 109 mmol/L (ref 98–110)
Creat: 0.89 mg/dL (ref 0.50–1.03)
Glucose, Bld: 104 mg/dL — ABNORMAL HIGH (ref 65–99)
Potassium: 4.2 mmol/L (ref 3.5–5.3)
Sodium: 139 mmol/L (ref 135–146)
eGFR: 77 mL/min/{1.73_m2} (ref >=60)

## 2021-01-30 LAB — PHOSPHORUS: Phosphorus: 4.5 mg/dL (ref 2.5–4.5)

## 2021-01-30 LAB — MAGNESIUM: Magnesium: 1.1 mg/dL — ABNORMAL LOW (ref 1.5–2.5)

## 2021-01-30 NOTE — Progress Notes (Signed)
Most of your labs look great! Magnesium is still low, so start taking an over the counter magnesium supplement and we can recheck this in the future.

## 2021-01-31 ENCOUNTER — Other Ambulatory Visit: Payer: Self-pay | Admitting: Family Medicine

## 2021-01-31 DIAGNOSIS — I10 Essential (primary) hypertension: Secondary | ICD-10-CM

## 2021-01-31 DIAGNOSIS — R7301 Impaired fasting glucose: Secondary | ICD-10-CM

## 2021-02-09 ENCOUNTER — Ambulatory Visit: Payer: 59

## 2021-02-09 ENCOUNTER — Ambulatory Visit: Payer: 59 | Admitting: Family Medicine

## 2021-02-10 ENCOUNTER — Other Ambulatory Visit: Payer: Self-pay

## 2021-02-10 ENCOUNTER — Ambulatory Visit (INDEPENDENT_AMBULATORY_CARE_PROVIDER_SITE_OTHER): Payer: 59 | Admitting: Family Medicine

## 2021-02-10 ENCOUNTER — Encounter: Payer: Self-pay | Admitting: Family Medicine

## 2021-02-10 VITALS — BP 158/86 | HR 87

## 2021-02-10 DIAGNOSIS — I10 Essential (primary) hypertension: Secondary | ICD-10-CM

## 2021-02-10 MED ORDER — SPIRONOLACTONE 25 MG PO TABS: 25.0000 mg | ORAL_TABLET | Freq: Every day | ORAL | 0 refills | Status: DC

## 2021-02-10 NOTE — Progress Notes (Signed)
Pt here for nurse BP check.  Initial BP was 163/85.  Had pt sit for approximately 10 minutes and then recheck BP, which was 158/86.  Pt states that she DC'd amlodipine 3 days ago d/t significant edema in the feet and ankles that was very painful.  Please advise.  Tiajuana Amass, CMA

## 2021-02-10 NOTE — Progress Notes (Signed)
HTN -uncontrolled.  Continue metoprolol.  We will add spironolactone in place of the amlodipine.  Please add amlodipine to intolerance list.  Have her schedule a follow-up appointment 1 to 2 weeks with nurse visit.  She will need a BMP at that time.

## 2021-02-13 NOTE — Progress Notes (Signed)
LVM for pt to return call to discuss.  T. Vanessa Valdez, CMA 

## 2021-02-16 ENCOUNTER — Other Ambulatory Visit: Payer: Self-pay

## 2021-02-16 DIAGNOSIS — I10 Essential (primary) hypertension: Secondary | ICD-10-CM

## 2021-02-16 NOTE — Telephone Encounter (Signed)
Pt called stating that she feels like the spironolactone is keeping her awake at night.  She also states that her BP readings have been very high and concerning to her.  Pt states that her lowest systolic reading has been 162 and she has no diastolic readings less than 90.  Pt states that her BP last night was 183/110.  Please advise.  Tiajuana Amass, CMA

## 2021-02-16 NOTE — Telephone Encounter (Signed)
Can we verify her med list.  I am not sure she is taking the amlodipine anymore but I just want to make sure.  I think she is taken the metoprolol 100 mg in the spironolactone.  In which case I would highly recommend adding valsartan hand but I just want to make sure before I send that in.

## 2021-02-17 MED ORDER — VALSARTAN 160 MG PO TABS: 160.0000 mg | ORAL_TABLET | Freq: Every day | ORAL | 0 refills | Status: DC

## 2021-02-17 NOTE — Telephone Encounter (Signed)
Pt is not taking amlodipine d/t swelling that it caused.  She is taking the metoprolol and spironolactone.  However, her blood pressure is not controlled and she is experiencing lots of muscle cramps and pains.  Please advise.  Tiajuana Amass, CMA

## 2021-02-17 NOTE — Telephone Encounter (Signed)
OK will start valsartan in place of the spironolactone. Keep taking the metoprolol as well. F/U in our office in one week for BP and BMP check

## 2021-02-18 NOTE — Telephone Encounter (Signed)
Pt informed.  Pt expressed understanding and is agreeable.  Pt scheduled for nurse visit and labs on 02/25/2021.  Tiajuana Amass, CMA

## 2021-02-25 ENCOUNTER — Ambulatory Visit (INDEPENDENT_AMBULATORY_CARE_PROVIDER_SITE_OTHER): Payer: 59 | Admitting: Osteopathic Medicine

## 2021-02-25 VITALS — BP 147/85 | HR 86

## 2021-02-25 DIAGNOSIS — I10 Essential (primary) hypertension: Secondary | ICD-10-CM

## 2021-02-25 DIAGNOSIS — E119 Type 2 diabetes mellitus without complications: Secondary | ICD-10-CM

## 2021-02-25 MED ORDER — VALSARTAN 160 MG PO TABS: 320.0000 mg | ORAL_TABLET | Freq: Every day | ORAL | 0 refills | Status: DC

## 2021-02-25 NOTE — Progress Notes (Signed)
BP Readings from Last 3 Encounters:  02/25/21 (!) 147/85  02/10/21 (!) 158/86  01/29/21 (!) 143/83   DM2, goal BP <130/80, Rx adjusted    The primary encounter diagnosis was HYPERTENSION, BENIGN ESSENTIAL. Diagnoses of Type 2 diabetes mellitus without complication, without long-term current use of insulin (HCC) and Essential hypertension, benign were also pertinent to this visit.  Meds ordered this encounter  Medications   valsartan (DIOVAN) 160 MG tablet    Sig: Take 2 tablets (320 mg total) by mouth daily.    Dispense:  30 tablet    Refill:  0   Return in about 1 week (around 03/04/2021) for NURSE VISIT RECHECK BP ON HIGHER DOSE VALSARTAN .

## 2021-02-25 NOTE — Progress Notes (Signed)
Pt came in today for BP check.  Initial reading was 151/84.  BP was repeated after 10 minutes of sitting and was 147/85.  Pt brought home BP readings with her to be reviewed.  Please advise if she needs any medication changes.  Tiajuana Amass, CMA

## 2021-02-27 ENCOUNTER — Telehealth: Payer: Self-pay

## 2021-02-27 NOTE — Telephone Encounter (Signed)
Pt informed of increased dose of valsartan.  Pt states that she has already picked up this medication and started taking it.  Pt scheduled for nurse visit to recheck BP on 03/04/2021.  Tiajuana Amass, CMA

## 2021-03-04 ENCOUNTER — Other Ambulatory Visit: Payer: Self-pay

## 2021-03-04 ENCOUNTER — Ambulatory Visit (INDEPENDENT_AMBULATORY_CARE_PROVIDER_SITE_OTHER): Payer: 59 | Admitting: Family Medicine

## 2021-03-04 VITALS — BP 134/74 | HR 83 | Temp 98.2°F

## 2021-03-04 DIAGNOSIS — I10 Essential (primary) hypertension: Secondary | ICD-10-CM

## 2021-03-04 MED ORDER — VALSARTAN 320 MG PO TABS: 320.0000 mg | ORAL_TABLET | Freq: Every day | ORAL | 3 refills | Status: DC

## 2021-03-04 NOTE — Progress Notes (Signed)
Patient presents today as a nurse visit for a blood pressure check.  Patient states she is taking her medication as prescribed without any side effects/adverse effects. Medication and allergy list reviewed with patient and the pharmacy has been verified.   HA: No Dizziness/lightheadedness: No Fever: No BA: No Weakness/Fatigue: No  Sinus pain/pressure: No  Runny nose: No  ST: No  ShOB: No  CP: No  Palps: No Abd pain: No Dysuria: No  N/V/C/D: No    Vital Signs at 3:09 PM Blood Pressure: 134/74 Pulse: 83 SpO2: 100%  Information shared with Dr. Linford Arnold who instructed me to let pt know:  Continue current regimen of metoprolol 100 mg twice a day, valsartan 160 mg, 2 tablets daily (total of 320 mg). A new prescription for valsartan 320 mg to take 1 tablet daily has been sent to the pharmacy. Continue to monitor BP's at home and keep a log. We will check a BMP today since there was an increase in meds at the last nurse visit. Follow up in 1 month.   Pt aware and verbalized understanding.

## 2021-03-05 LAB — BASIC METABOLIC PANEL
BUN: 16 mg/dL (ref 7–25)
CO2: 25 mmol/L (ref 20–32)
Calcium: 10.2 mg/dL (ref 8.6–10.4)
Chloride: 102 mmol/L (ref 98–110)
Creat: 0.77 mg/dL (ref 0.50–1.03)
Glucose, Bld: 113 mg/dL — ABNORMAL HIGH (ref 65–99)
Potassium: 4.2 mmol/L (ref 3.5–5.3)
Sodium: 136 mmol/L (ref 135–146)

## 2021-03-05 NOTE — Progress Notes (Signed)
Your lab work is within acceptable range and there are no concerning findings.   ?

## 2021-03-18 ENCOUNTER — Other Ambulatory Visit: Payer: Self-pay | Admitting: Family Medicine

## 2021-03-18 ENCOUNTER — Encounter: Payer: Self-pay | Admitting: Family Medicine

## 2021-03-18 DIAGNOSIS — I10 Essential (primary) hypertension: Secondary | ICD-10-CM

## 2021-03-26 ENCOUNTER — Other Ambulatory Visit: Payer: Self-pay

## 2021-03-26 ENCOUNTER — Ambulatory Visit (INDEPENDENT_AMBULATORY_CARE_PROVIDER_SITE_OTHER): Payer: 59 | Admitting: Family Medicine

## 2021-03-26 VITALS — BP 137/63 | HR 56

## 2021-03-26 DIAGNOSIS — I10 Essential (primary) hypertension: Secondary | ICD-10-CM

## 2021-03-26 MED ORDER — AMLODIPINE BESYLATE 2.5 MG PO TABS: 2.5000 mg | ORAL_TABLET | Freq: Every day | ORAL | 0 refills | Status: DC

## 2021-03-26 NOTE — Progress Notes (Deleted)
Patient comes in today for blood pressure check.   Vanessa Valdez is taking Metoprolol 100 mg tablets one twice daily and Valsartan 320 mg one daily for blood pressure control. She denies any missed doses, side effects, chest pain, palpitations, dizziness, or shortness of breath. She is having occasional headaches but attributes that to the weather changing.   Vanessa Valdez has been checking blood pressure readings at home. She did not bring a log with her but reports the average reading is 130-135/75.  Her first blood pressure reading today is: 146/59. She did rush in from the rain. After sitting, her second reading is: 137/63.

## 2021-03-26 NOTE — Progress Notes (Signed)
Blood pressure is almost there we need to get it consistently in the 120s.  So it is looking better overall.  I want to stay away from hydrochlorothiazide because of the severe hyponatremia.  Some can add a prescription for a medicine called amlodipine which is can do a real small dose of 2.5 mg.  This is in addition to the valsartan and the metoprolol.  I am hoping that this gets [place] that 5-10 point reduction that she still needs.  It looks like she has her appointment with cardiology next week which is great.  Please also encouraged her to schedule her mammogram, flu shot, and eye exam when she can.

## 2021-03-26 NOTE — Progress Notes (Signed)
   Patient ID: Vanessa Valdez, female   DOB: [birth year], 55 y.o.   MRN: [id number]  Patient comes in today for blood pressure check.   Vanessa Valdez is taking Metoprolol 100 mg tablets one twice daily and Valsartan 320 mg one daily for blood pressure control. She denies any missed doses, side effects, chest pain, palpitations, dizziness, or shortness of breath. She is having occasional headaches but attributes that to the weather changing.   Vanessa Valdez has been checking blood pressure readings at home. She did not bring a log with her but reports the average reading is 130-135/75.  Her first blood pressure reading today is: 146/59. She did rush in from the rain. After sitting, her second reading is: 137/63.   Patient made aware I would share these results with Dr. Linford Arnold and we would contact her with any changes that would be needed.

## 2021-03-26 NOTE — Progress Notes (Signed)
Spoke with patient. She states she had a reaction to Amlodipine. It doesn't look like this was documented in allergy list. I added all prior blood pressure medications tried to that list for easier access.   Amlodipine - caused severe leg swelling Lisinopril - caused pancreatitis  HCTZ - caused low potassium/sodium levels Spironolactone - can't remember reaction  Please advise.

## 2021-03-27 MED ORDER — VERAPAMIL HCL ER 120 MG PO TBCR: 120.0000 mg | EXTENDED_RELEASE_TABLET | Freq: Every day | ORAL | 0 refills | Status: DC

## 2021-03-27 NOTE — Progress Notes (Signed)
OK, thank you for updating her list.    Meds ordered this encounter  Medications   DISCONTD: amLODipine (NORVASC) 2.5 MG tablet    Sig: Take 1 tablet (2.5 mg total) by mouth daily.    Dispense:  30 tablet    Refill:  0   verapamil (CALAN-SR) 120 MG CR tablet    Sig: Take 1 tablet (120 mg total) by mouth at bedtime.    Dispense:  30 tablet    Refill:  0

## 2021-03-27 NOTE — Addendum Note (Signed)
Addended by: Nani Gasser D on: 03/27/2021 10:30 AM   Modules accepted: Orders

## 2021-03-27 NOTE — Progress Notes (Signed)
Referring-Catherine Metheney MD Reason for referral-coronary artery disease  HPI: 55 year old female for evaluation of coronary artery disease at request of Nani Gasser MD.  Abdominal CT June 2022 showed atherosclerosis of the coronary arteries.  Patient has dyspnea with more vigorous activities but not routine activities.  No orthopnea, PND, pedal edema, exertional chest pain or syncope.  Cardiology now asked to evaluate.  Current Outpatient Medications  Medication Sig Dispense Refill   atorvastatin (LIPITOR) 20 MG tablet Take 1 tablet (20 mg total) by mouth daily. 90 tablet 3   cholecalciferol (VITAMIN D3) 25 MCG (1000 UNIT) tablet Take 1,000 Units by mouth daily.     esomeprazole (NEXIUM) 20 MG capsule Take 20 mg by mouth daily at 12 noon.     metFORMIN (GLUCOPHAGE) 500 MG tablet Take 1 tablet (500 mg total) by mouth 2 (two) times daily with a meal.     metoprolol tartrate (LOPRESSOR) 100 MG tablet TAKE 1 TABLET(100 MG) BY MOUTH TWICE DAILY 180 tablet 0   valsartan (DIOVAN) 320 MG tablet Take 1 tablet (320 mg total) by mouth daily. 90 tablet 3   verapamil (CALAN-SR) 120 MG CR tablet Take 1 tablet (120 mg total) by mouth at bedtime. 30 tablet 0   No current facility-administered medications for this visit.    Allergies  Allergen Reactions   Sumatriptan     Throat closing, neck swelling   Amlodipine Swelling   Codeine Sulfate     REACTION: makes nauseated   Hctz [Hydrochlorothiazide]     Caused low sodium/potassium levels   Lisinopril     Pancreatitis    Penicillins Other (See Comments)    Stomach Cramps   Spironolactone     Can't remember reaction   Zomig [Zolmitriptan] Other (See Comments)    Neck felt really tight.    Erythromycin Nausea Only   Garlic Nausea And Vomiting and Rash     Past Medical History:  Diagnosis Date   Allergy    Depression    Diabetes mellitus without complication (HCC)    Hyperlipidemia    Hypertension 1997   Impaired fasting  glucose    Insomnia    Migraines    Preeclampsia    Vaginal Pap smear, abnormal     Past Surgical History:  Procedure Laterality Date   No prior surgery      Social History   Socioeconomic History   Marital status: Married    Spouse name: Not on file   Number of children: 1   Years of education: Not on file   Highest education level: Not on file  Occupational History   Not on file  Tobacco Use   Smoking status: Never   Smokeless tobacco: Never  Substance and Sexual Activity   Alcohol use: Yes    Comment: Occasional   Drug use: No   Sexual activity: Yes  Other Topics Concern   Not on file  Social History Narrative   Not on file   Social Determinants of Health   Financial Resource Strain: Not on file  Food Insecurity: Not on file  Transportation Needs: Not on file  Physical Activity: Not on file  Stress: Not on file  Social Connections: Not on file  Intimate Partner Violence: Not on file    Family History  Problem Relation Age of Onset   Hypertension Mother    Diabetes Father    Cancer Paternal Grandmother 10       colon   Cancer Paternal Grandfather  myleoma    ROS: no fevers or chills, productive cough, hemoptysis, dysphasia, odynophagia, melena, hematochezia, dysuria, hematuria, rash, seizure activity, orthopnea, PND, pedal edema, claudication. Remaining systems are negative.  Physical Exam:   Blood pressure (!) 151/85, pulse 68, height 5\' 3"  (1.6 m), weight 157 lb 6.4 oz (71.4 kg), last menstrual period 05/17/2017.  General:  Well developed/well nourished in NAD Skin warm/dry Patient not depressed No peripheral clubbing Back-normal HEENT-normal/normal eyelids Neck supple/normal carotid upstroke bilaterally; no bruits; no JVD; no thyromegaly chest - CTA/ normal expansion CV - RRR/normal S1 and S2; no murmurs, rubs or gallops;  PMI nondisplaced Abdomen -NT/ND, no HSM, no mass, + bowel sounds, no bruit 2+ femoral pulses, no bruits Ext-no  edema, chords, 2+ DP Neuro-grossly nonfocal  ECG -02/14/2021-sinus rhythm with no ST changes.  Personally reviewed Today's electrocardiogram shows sinus rhythm at a rate of 68, normal axis, left ventricular hypertrophy.  Personally reviewed. A/P  1 coronary artery disease-based on abdominal CT demonstrating coronary calcification.  I will arrange a calcium score for further risk stratification.  Patient is not having chest pain.  If calcium score significantly elevated will advance statin.  2 hypertension-blood pressure mildly elevated.  However she follows this at home and it is typically controlled.  Continue present medications and follow.  3 hyperlipidemia-continue statin.  As above we will advance dose if calcium score significantly elevated.  02/16/2021, MD

## 2021-03-27 NOTE — Progress Notes (Signed)
Patient made aware of medication addition. She will check blood pressures at home and report back.

## 2021-04-01 ENCOUNTER — Encounter: Payer: Self-pay | Admitting: Cardiology

## 2021-04-01 ENCOUNTER — Other Ambulatory Visit: Payer: Self-pay

## 2021-04-01 ENCOUNTER — Ambulatory Visit (INDEPENDENT_AMBULATORY_CARE_PROVIDER_SITE_OTHER): Payer: 59 | Admitting: Cardiology

## 2021-04-01 VITALS — BP 151/85 | HR 68 | Ht 63.0 in | Wt 157.4 lb

## 2021-04-01 DIAGNOSIS — I2510 Atherosclerotic heart disease of native coronary artery without angina pectoris: Secondary | ICD-10-CM

## 2021-04-01 DIAGNOSIS — E7800 Pure hypercholesterolemia, unspecified: Secondary | ICD-10-CM | POA: Diagnosis not present

## 2021-04-01 DIAGNOSIS — I10 Essential (primary) hypertension: Secondary | ICD-10-CM

## 2021-04-01 NOTE — Patient Instructions (Signed)
Medication Instructions:   Your physician recommends that you continue on your current medications as directed. Please refer to the Current Medication list given to you today.  *If you need a refill on your cardiac medications before your next appointment, please call your pharmacy*   Lab Work:  -NONE  If you have labs (blood work) drawn today and your tests are completely normal, you will receive your results only by: MyChart Message (if you have MyChart) OR A paper copy in the mail If you have any lab test that is abnormal or we need to change your treatment, we will call you to review the results.   Testing/Procedures:  Calcium Scoring   Follow-Up: At [facility], you and your health needs are our priority.  As part of our continuing mission to provide you with exceptional heart care, we have created designated Provider Care Teams.  These Care Teams include your primary Cardiologist (physician) and Advanced Practice Providers (APPs -  Physician Assistants and Nurse Practitioners) who all work together to provide you with the care you need, when you need it.  We recommend signing up for the patient portal called "MyChart".  Sign up information is provided on this After Visit Summary.  MyChart is used to connect with patients for Virtual Visits (Telemedicine).  Patients are able to view lab/test results, encounter notes, upcoming appointments, etc.  Non-urgent messages can be sent to your provider as well.   To learn more about what you can do with MyChart, go to [email or web address].    Your next appointment:   1 year(s)  The format for your next appointment:   In Person  Provider:   Dr. Jens Som   Other Instructions -None

## 2021-04-03 ENCOUNTER — Ambulatory Visit: Payer: 59

## 2021-04-17 NOTE — Addendum Note (Signed)
Addended by: Parke Poisson on: 04/17/2021 01:35 PM   Modules accepted: Orders

## 2021-04-20 ENCOUNTER — Ambulatory Visit (INDEPENDENT_AMBULATORY_CARE_PROVIDER_SITE_OTHER)
Admission: RE | Admit: 2021-04-20 | Discharge: 2021-04-20 | Disposition: A | Discharge status: Home / Self Care | Payer: Self-pay | Source: Ambulatory Visit | Attending: Cardiology | Admitting: Cardiology

## 2021-04-20 ENCOUNTER — Other Ambulatory Visit: Payer: Self-pay

## 2021-04-20 DIAGNOSIS — I2510 Atherosclerotic heart disease of native coronary artery without angina pectoris: Secondary | ICD-10-CM

## 2021-04-21 ENCOUNTER — Telehealth: Payer: Self-pay | Admitting: *Deleted

## 2021-04-21 DIAGNOSIS — E7800 Pure hypercholesterolemia, unspecified: Secondary | ICD-10-CM

## 2021-04-21 DIAGNOSIS — I2510 Atherosclerotic heart disease of native coronary artery without angina pectoris: Secondary | ICD-10-CM

## 2021-04-21 MED ORDER — ATORVASTATIN CALCIUM 80 MG PO TABS
80.0000 mg | ORAL_TABLET | Freq: Every day | ORAL | 3 refills | Status: DC
Start: 2021-04-21 — End: 2022-05-06

## 2021-04-21 NOTE — Telephone Encounter (Signed)
-----   Message from Lewayne Bunting, MD sent at 04/21/2021  7:16 AM EDT ----- Ca score elevated; arrange stress nuclear study; increase lipitor to 80 mg daily; lipids and liver 12 weeks Olga Millers

## 2021-04-21 NOTE — Telephone Encounter (Signed)
pt aware of results  New script sent to the pharmacy  Lab orders mailed to the pt  Order placed for nuclear test

## 2021-04-22 ENCOUNTER — Other Ambulatory Visit: Payer: Self-pay | Admitting: Family Medicine

## 2021-04-22 ENCOUNTER — Telehealth (HOSPITAL_COMMUNITY): Payer: Self-pay

## 2021-04-22 DIAGNOSIS — R7301 Impaired fasting glucose: Secondary | ICD-10-CM

## 2021-04-22 NOTE — Telephone Encounter (Signed)
Spoke with the patient, detailed instructions given. She stated that she would be here for test. Asked to call back with any questions. S.Daysie Helf EMTP 

## 2021-04-28 ENCOUNTER — Other Ambulatory Visit: Payer: Self-pay

## 2021-04-28 ENCOUNTER — Ambulatory Visit (HOSPITAL_COMMUNITY): Payer: 59 | Attending: Cardiology

## 2021-04-28 DIAGNOSIS — I2510 Atherosclerotic heart disease of native coronary artery without angina pectoris: Secondary | ICD-10-CM

## 2021-04-28 LAB — MYOCARDIAL PERFUSION IMAGING
Angina Index: 0
Duke Treadmill Score: 5
Estimated workload: 7
Exercise duration (min): 5 min
Exercise duration (sec): 0 s
LV dias vol: 33 mL (ref 46–106)
LV sys vol: 10 mL
MPHR: 166 {beats}/min
Nuc Stress EF: 71 %
Peak HR: 169 {beats}/min
Percent HR: 101 %
Rest HR: 100 {beats}/min
Rest Nuclear Isotope Dose: 11 mCi
SDS: 3
SRS: 0
SSS: 3
ST Depression (mm): 0 mm
Stress Nuclear Isotope Dose: 30.6 mCi
TID: 0.8

## 2021-04-28 MED ORDER — TECHNETIUM TC 99M TETROFOSMIN IV KIT
11.0000 | PACK | Freq: Once | INTRAVENOUS | Status: AC | PRN
  Administered 2021-04-28: 11 via INTRAVENOUS
  Filled 2021-04-28: qty 11

## 2021-04-28 MED ORDER — TECHNETIUM TC 99M TETROFOSMIN IV KIT
30.6000 | PACK | Freq: Once | INTRAVENOUS | Status: AC | PRN
Start: 2021-04-28 — End: 2021-04-28
  Administered 2021-04-28: 30.6 via INTRAVENOUS
  Filled 2021-04-28: qty 31

## 2021-06-08 ENCOUNTER — Other Ambulatory Visit: Payer: Self-pay | Admitting: Family Medicine

## 2021-06-08 DIAGNOSIS — I10 Essential (primary) hypertension: Secondary | ICD-10-CM

## 2021-06-08 DIAGNOSIS — R7301 Impaired fasting glucose: Secondary | ICD-10-CM

## 2021-07-05 ENCOUNTER — Other Ambulatory Visit: Payer: Self-pay | Admitting: Family Medicine

## 2021-08-21 ENCOUNTER — Encounter: Payer: Self-pay | Admitting: *Deleted

## 2021-08-25 ENCOUNTER — Other Ambulatory Visit: Payer: Self-pay

## 2021-08-25 DIAGNOSIS — Z1211 Encounter for screening for malignant neoplasm of colon: Secondary | ICD-10-CM

## 2021-09-01 ENCOUNTER — Other Ambulatory Visit: Payer: Self-pay | Admitting: Family Medicine

## 2021-09-01 DIAGNOSIS — I10 Essential (primary) hypertension: Secondary | ICD-10-CM

## 2021-09-01 DIAGNOSIS — R7301 Impaired fasting glucose: Secondary | ICD-10-CM

## 2021-09-30 ENCOUNTER — Other Ambulatory Visit: Payer: Self-pay | Admitting: Family Medicine

## 2021-10-27 ENCOUNTER — Other Ambulatory Visit: Payer: Self-pay | Admitting: Family Medicine

## 2021-10-27 DIAGNOSIS — R7301 Impaired fasting glucose: Secondary | ICD-10-CM

## 2021-10-27 NOTE — Telephone Encounter (Signed)
Patient has been scheduled on 11/16/2021 with PCP. AM ?

## 2021-10-27 NOTE — Telephone Encounter (Signed)
Please call pt and have her to schedule a f/u for DM and BP she will need to have her A1c checked, refill sent for now. ?

## 2021-11-16 ENCOUNTER — Ambulatory Visit (INDEPENDENT_AMBULATORY_CARE_PROVIDER_SITE_OTHER): Payer: 59 | Admitting: Family Medicine

## 2021-11-16 ENCOUNTER — Encounter: Payer: Self-pay | Admitting: Family Medicine

## 2021-11-16 VITALS — BP 157/83 | HR 70 | Resp 18 | Ht 63.0 in | Wt 157.0 lb

## 2021-11-16 DIAGNOSIS — Z1231 Encounter for screening mammogram for malignant neoplasm of breast: Secondary | ICD-10-CM

## 2021-11-16 DIAGNOSIS — I10 Essential (primary) hypertension: Secondary | ICD-10-CM

## 2021-11-16 DIAGNOSIS — E119 Type 2 diabetes mellitus without complications: Secondary | ICD-10-CM | POA: Diagnosis not present

## 2021-11-16 DIAGNOSIS — R7301 Impaired fasting glucose: Secondary | ICD-10-CM | POA: Diagnosis not present

## 2021-11-16 LAB — POCT UA - MICROALBUMIN
Creatinine, POC: 10 mg/dL
Microalbumin Ur, POC: 10 mg/L

## 2021-11-16 LAB — POCT GLYCOSYLATED HEMOGLOBIN (HGB A1C): Hemoglobin A1C: 5.3 % (ref 4.0–5.6)

## 2021-11-16 MED ORDER — METFORMIN HCL 500 MG PO TABS: 500.0000 mg | ORAL_TABLET | Freq: Every day | ORAL | 0 refills | Status: DC

## 2021-11-16 NOTE — Assessment & Plan Note (Signed)
Repeat BP was still high.  Return in 2 weeks for nurse visit. ?

## 2021-11-16 NOTE — Progress Notes (Signed)
? ?Established Patient Office Visit ? ?Subjective   ?Patient ID: Vanessa Valdez, female    DOB: May 20, 1966  Age: 56 y.o. MRN: [id number] ? ?Chief Complaint  ?Patient presents with  ? Diabetes  ?  Follow up   ? Hypertension  ?  Follow up   ? Diabetes Eye Exam  ?  Patient will call to schedule appointment   ? Colonoscopy  ?  Patient will return Cologuard Kit   ? ? ?HPI ? ? ?Hypertension- Pt denies chest pain, SOB, dizziness, or heart palpitations.  Taking meds as directed w/o problems.  Denies medication side effects.  Is really made some major changes to her diet.  She is now following a pretty strict Mediterranean diet she is actually doing really well with that. ? ?Diabetes - no hypoglycemic events. No wounds or sores that are not healing well. No increased thirst or urination. Checking glucose at home. Taking medications as prescribed without any side effects. ? ?She follows with cardiology, Dr. Kirk Ruths she was last seen in September.  She is due for some updated blood work.  They did do a cardiac CT score.  Again she is really changed her diet. ? ?ROS ? ?  ?Objective:  ?  ? ?BP (!) 157/83   Pulse 70   Resp 18   Ht '5\' 3"'  (1.6 m)   Wt 157 lb (71.2 kg)   LMP 05/17/2017 (Approximate)   SpO2 98%   BMI 27.81 kg/m?  ?  ? ?Physical Exam ?Vitals and nursing note reviewed.  ?Constitutional:   ?   Appearance: She is well-developed.  ?HENT:  ?   Head: Normocephalic and atraumatic.  ?Cardiovascular:  ?   Rate and Rhythm: Normal rate and regular rhythm.  ?   Heart sounds: Normal heart sounds.  ?Pulmonary:  ?   Effort: Pulmonary effort is normal.  ?   Breath sounds: Normal breath sounds.  ?Skin: ?   General: Skin is warm and dry.  ?Neurological:  ?   Mental Status: She is alert and oriented to person, place, and time.  ?Psychiatric:     ?   Behavior: Behavior normal.  ? ? ? ?Results for orders placed or performed in visit on 11/16/21  ?POCT HgB A1C  ?Result Value Ref Range  ? Hemoglobin A1C 5.3 4.0 - 5.6 %  ? HbA1c  POC (<> result, manual entry)    ? HbA1c, POC (prediabetic range)    ? HbA1c, POC (controlled diabetic range)    ?POCT UA - Microalbumin  ?Result Value Ref Range  ? Microalbumin Ur, POC 10 mg/L mg/L  ? Creatinine, POC 10 mg/dL mg/dL  ? Albumin/Creatinine Ratio, Urine, POC <73m/g   ? ? ?  ? ?The 10-year ASCVD risk score (Arnett DK, et al., 2019) is: 4.3% ? ?  ?Assessment & Plan:  ? ?Problem List Items Addressed This Visit   ? ?  ? Cardiovascular and Mediastinum  ? HYPERTENSION, BENIGN ESSENTIAL  ?  Repeat BP was still high.  Return in 2 weeks for nurse visit. ? ?  ?  ?  ? Endocrine  ? Type 2 diabetes mellitus without complication, without long-term current use of insulin ([place] - Primary  ?  A1c looks absolutely phenomenal today at 5.3 in fact it is back into the normal range.  Some to have her drop her metformin down to once a day and then plan to follow back up in about 4 months if still well controlled then we  will discontinue the metformin completely. ? ?  ?  ? Relevant Medications  ? metFORMIN (GLUCOPHAGE) 500 MG tablet  ? Other Relevant Orders  ? POCT HgB A1C (Completed)  ? COMPLETE METABOLIC PANEL WITH GFR  ? Magnesium  ? Lipid Panel w/reflex Direct LDL  ? CBC  ? POCT UA - Microalbumin (Completed)  ? IMPAIRED FASTING GLUCOSE  ? Relevant Medications  ? metFORMIN (GLUCOPHAGE) 500 MG tablet  ? ?Other Visit Diagnoses   ? ? Encounter for screening mammogram for malignant neoplasm of breast      ? Screening mammogram, encounter for      ? Relevant Orders  ? MM 3D SCREEN BREAST BILATERAL  ? ?  ? ? ?Return in about 4 months (around 03/19/2022) for Diabetes follow-up, Hypertension.  ? ? ?Beatrice Lecher, MD ? ?

## 2021-11-16 NOTE — Assessment & Plan Note (Signed)
A1c looks absolutely phenomenal today at 5.3 in fact it is back into the normal range.  Some to have her drop her metformin down to once a day and then plan to follow back up in about 4 months if still well controlled then we will discontinue the metformin completely. ?

## 2021-11-17 LAB — LIPID PANEL W/REFLEX DIRECT LDL
Cholesterol: 148 mg/dL (ref <200)
HDL: 101 mg/dL (ref >=50)
LDL Cholesterol (Calc): 33 mg/dL (calc)
Non-HDL Cholesterol (Calc): 47 mg/dL (calc) (ref <130)
Total CHOL/HDL Ratio: 1.5 (calc) (ref <5.0)
Triglycerides: 60 mg/dL (ref <150)

## 2021-11-17 LAB — CBC
HCT: 35.8 % (ref 35.0–45.0)
Hemoglobin: 12.1 g/dL (ref 11.7–15.5)
MCH: 29.7 pg (ref 27.0–33.0)
MCHC: 33.8 g/dL (ref 32.0–36.0)
MCV: 88 fL (ref 80.0–100.0)
MPV: 12 fL (ref 7.5–12.5)
Platelets: 260 10*3/uL (ref 140–400)
RBC: 4.07 10*6/uL (ref 3.80–5.10)
RDW: 12.8 % (ref 11.0–15.0)
WBC: 6 10*3/uL (ref 3.8–10.8)

## 2021-11-17 LAB — COMPLETE METABOLIC PANEL WITH GFR
AG Ratio: 1.7 (calc) (ref 1.0–2.5)
ALT: 28 U/L (ref 6–29)
AST: 26 U/L (ref 10–35)
Albumin: 4.4 g/dL (ref 3.6–5.1)
Alkaline phosphatase (APISO): 98 U/L (ref 37–153)
BUN: 14 mg/dL (ref 7–25)
CO2: 21 mmol/L (ref 20–32)
Calcium: 9.8 mg/dL (ref 8.6–10.4)
Chloride: 95 mmol/L — ABNORMAL LOW (ref 98–110)
Creat: 0.91 mg/dL (ref 0.50–1.03)
Globulin: 2.6 g/dL (calc) (ref 1.9–3.7)
Glucose, Bld: 109 mg/dL (ref 65–139)
Potassium: 4.4 mmol/L (ref 3.5–5.3)
Sodium: 129 mmol/L — ABNORMAL LOW (ref 135–146)
Total Bilirubin: 0.7 mg/dL (ref 0.2–1.2)
Total Protein: 7 g/dL (ref 6.1–8.1)
eGFR: 75 mL/min/{1.73_m2} (ref >=60)

## 2021-11-17 LAB — MAGNESIUM: Magnesium: 1.3 mg/dL — ABNORMAL LOW (ref 1.5–2.5)

## 2021-11-17 NOTE — Progress Notes (Signed)
Hi Vanessa Valdez, sodium was low again.  Similar to what it had been doing in about a year ago.  It is low enough that we really do need to recheck it in about a week.  Normally run in the low 130s this was actually 129.  Liver function is normal.  Magnesium is still low so please make sure you are taking an over-the-counter magnesium supplement if you are not already if you are taking 1 then please double up.  I like to recheck that in about 2 months.  Your cholesterol overall looks good and blood count is normal.

## 2021-11-27 ENCOUNTER — Other Ambulatory Visit: Payer: Self-pay | Admitting: Family Medicine

## 2021-11-27 DIAGNOSIS — R7301 Impaired fasting glucose: Secondary | ICD-10-CM

## 2021-11-27 DIAGNOSIS — I10 Essential (primary) hypertension: Secondary | ICD-10-CM

## 2021-11-30 ENCOUNTER — Ambulatory Visit (INDEPENDENT_AMBULATORY_CARE_PROVIDER_SITE_OTHER): Payer: 59 | Admitting: Family Medicine

## 2021-11-30 VITALS — BP 135/64 | HR 70

## 2021-11-30 DIAGNOSIS — I10 Essential (primary) hypertension: Secondary | ICD-10-CM

## 2021-11-30 NOTE — Progress Notes (Signed)
BP ok today.  Continue currenet regimen.  Keep regularly scheduled follow-up. ?

## 2021-11-30 NOTE — Progress Notes (Signed)
Pt here for nurse BP check.  Pt denies CP, SOB, headaches, dizziness or missed doses of medications.  T. Lovelace Cerveny, CMA ? ?

## 2021-12-31 ENCOUNTER — Other Ambulatory Visit: Payer: Self-pay | Admitting: Family Medicine

## 2022-01-07 ENCOUNTER — Other Ambulatory Visit: Payer: Self-pay | Admitting: Physician Assistant

## 2022-01-07 DIAGNOSIS — Z8262 Family history of osteoporosis: Secondary | ICD-10-CM

## 2022-01-07 DIAGNOSIS — Z13820 Encounter for screening for osteoporosis: Secondary | ICD-10-CM

## 2022-01-07 DIAGNOSIS — S22070D Wedge compression fracture of T9-T10 vertebra, subsequent encounter for fracture with routine healing: Secondary | ICD-10-CM

## 2022-01-25 ENCOUNTER — Other Ambulatory Visit: Payer: Self-pay | Admitting: Family Medicine

## 2022-01-25 DIAGNOSIS — R7301 Impaired fasting glucose: Secondary | ICD-10-CM

## 2022-02-05 ENCOUNTER — Other Ambulatory Visit: Payer: Self-pay | Admitting: *Deleted

## 2022-02-05 DIAGNOSIS — I10 Essential (primary) hypertension: Secondary | ICD-10-CM

## 2022-02-05 MED ORDER — VALSARTAN 320 MG PO TABS: 320.0000 mg | ORAL_TABLET | Freq: Every day | ORAL | 1 refills | Status: DC

## 2022-02-25 ENCOUNTER — Other Ambulatory Visit: Payer: Self-pay | Admitting: Family Medicine

## 2022-02-25 DIAGNOSIS — I10 Essential (primary) hypertension: Secondary | ICD-10-CM

## 2022-02-25 DIAGNOSIS — R7301 Impaired fasting glucose: Secondary | ICD-10-CM

## 2022-03-19 ENCOUNTER — Ambulatory Visit (INDEPENDENT_AMBULATORY_CARE_PROVIDER_SITE_OTHER): Payer: 59 | Admitting: Family Medicine

## 2022-03-19 ENCOUNTER — Encounter: Payer: Self-pay | Admitting: Family Medicine

## 2022-03-19 VITALS — BP 137/70 | HR 73 | Ht 63.0 in | Wt 151.4 lb

## 2022-03-19 DIAGNOSIS — E119 Type 2 diabetes mellitus without complications: Secondary | ICD-10-CM | POA: Diagnosis not present

## 2022-03-19 DIAGNOSIS — I10 Essential (primary) hypertension: Secondary | ICD-10-CM

## 2022-03-19 LAB — POCT GLYCOSYLATED HEMOGLOBIN (HGB A1C): HbA1c, POC (controlled diabetic range): 5.4 % (ref 0.0–7.0)

## 2022-03-19 NOTE — Assessment & Plan Note (Signed)
Blood pressure little borderline elevated today we discussed his blood pressure optimally needs to be under 130.  She is going to track at home over the next couple of months and see if the majority of them are under 130 if they are not she will let me know via MyChart and we can make an adjustment to her regimen.  Unfortunately she does not tolerate HCTZ or spironolactone.

## 2022-03-19 NOTE — Assessment & Plan Note (Signed)
A1c looks absolutely phenomenal on decreased dose of metformin she is now just taking 500 mg once a day.  She is really made some dramatic changes to her diet and has lost about 6 pounds.  Not currently exercising regularly but plans on getting back on track with that.  Discussed the option of in the future discontinuing metformin but for now she is going to keep with it.  I will see her back in about 4 months in January.

## 2022-03-19 NOTE — Progress Notes (Signed)
Established Patient Office Visit  Subjective   Patient ID: Vanessa Valdez, female    DOB: Feb 27, 1966  Age: 56 y.o. MRN: [id number]  Chief Complaint  Patient presents with   Hypertension   Diabetes    HPI  Hypertension- Pt denies chest pain, SOB, dizziness, or heart palpitations.  Taking meds as directed w/o problems.  Denies medication side effects.  She has been checking her blood pressures at home.  Got a new cuff about 6 months ago she has been getting a fair number blood pressures in the 130s but some occasional good ones in the 120s as well.  In fact yesterday her blood pressure was 121/76.  She reports she has not been exercising as consistently but really has been trying to put a lot of focus on eating more healthy food choices.  Diabetes - no hypoglycemic events. No wounds or sores that are not healing well. No increased thirst or urination. Checking glucose at home. Taking medications as prescribed without any side effects.  She has dropped 6 pounds since she was last here.     ROS    Objective:     BP 137/70 (BP Location: Left Arm, Patient Position: Sitting, Cuff Size: Normal)   Pulse 73   Ht 5\' 3"  (1.6 m)   Wt 151 lb 6.4 oz (68.7 kg)   LMP 05/17/2017 (Approximate)   SpO2 97%   BMI 26.82 kg/m    Physical Exam Vitals and nursing note reviewed.  Constitutional:      Appearance: She is well-developed.  HENT:     Head: Normocephalic and atraumatic.  Cardiovascular:     Rate and Rhythm: Normal rate and regular rhythm.     Heart sounds: Normal heart sounds.  Pulmonary:     Effort: Pulmonary effort is normal.     Breath sounds: Normal breath sounds.  Skin:    General: Skin is warm and dry.  Neurological:     Mental Status: She is alert and oriented to person, place, and time.  Psychiatric:        Behavior: Behavior normal.     Results for orders placed or performed in visit on 03/19/22  POCT HgB A1C  Result Value Ref Range   Hemoglobin A1C     HbA1c POC  (<> result, manual entry)     HbA1c, POC (prediabetic range)     HbA1c, POC (controlled diabetic range) 5.4 0.0 - 7.0 %      The ASCVD Risk score (Arnett DK, et al., 2019) failed to calculate for the following reasons:   The valid HDL cholesterol range is 20 to 100 mg/dL    Assessment & Plan:   Problem List Items Addressed This Visit       Cardiovascular and Mediastinum   HYPERTENSION, BENIGN ESSENTIAL - Primary    Blood pressure little borderline elevated today we discussed his blood pressure optimally needs to be under 130.  She is going to track at home over the next couple of months and see if the majority of them are under 130 if they are not she will let me know via MyChart and we can make an adjustment to her regimen.  Unfortunately she does not tolerate HCTZ or spironolactone.        Endocrine   Type 2 diabetes mellitus without complication, without long-term current use of insulin (HCC)    A1c looks absolutely phenomenal on decreased dose of metformin she is now just taking 500 mg once a  day.  She is really made some dramatic changes to her diet and has lost about 6 pounds.  Not currently exercising regularly but plans on getting back on track with that.  Discussed the option of in the future discontinuing metformin but for now she is going to keep with it.  I will see her back in about 4 months in January.      Relevant Orders   POCT HgB A1C (Completed)    Return in about 4 months (around 08/02/2022) for Diabetes follow-up, Hypertension.    Nani Gasser, MD

## 2022-04-25 ENCOUNTER — Other Ambulatory Visit: Payer: Self-pay | Admitting: Family Medicine

## 2022-04-25 DIAGNOSIS — R7301 Impaired fasting glucose: Secondary | ICD-10-CM

## 2022-05-03 ENCOUNTER — Ambulatory Visit (INDEPENDENT_AMBULATORY_CARE_PROVIDER_SITE_OTHER): Payer: 59 | Admitting: Obstetrics and Gynecology

## 2022-05-03 ENCOUNTER — Other Ambulatory Visit (HOSPITAL_COMMUNITY)
Admission: RE | Admit: 2022-05-03 | Discharge: 2022-05-03 | Disposition: A | Discharge status: Home / Self Care | Payer: 59 | Source: Ambulatory Visit | Attending: Obstetrics and Gynecology | Admitting: Obstetrics and Gynecology

## 2022-05-03 ENCOUNTER — Encounter: Payer: Self-pay | Admitting: Obstetrics and Gynecology

## 2022-05-03 VITALS — BP 133/86 | HR 84 | Resp 16 | Ht 63.0 in | Wt 148.0 lb

## 2022-05-03 DIAGNOSIS — Z1231 Encounter for screening mammogram for malignant neoplasm of breast: Secondary | ICD-10-CM

## 2022-05-03 DIAGNOSIS — Z124 Encounter for screening for malignant neoplasm of cervix: Secondary | ICD-10-CM

## 2022-05-03 DIAGNOSIS — Z01419 Encounter for gynecological examination (general) (routine) without abnormal findings: Secondary | ICD-10-CM

## 2022-05-03 NOTE — Progress Notes (Signed)
Pt goes by [employer]

## 2022-05-03 NOTE — Progress Notes (Signed)
GYNECOLOGY ANNUAL PREVENTATIVE CARE ENCOUNTER NOTE  Subjective:   Vanessa Valdez is a 56 y.o. G81P0011 female here for a annual gynecologic exam. Current complaints: decreased libido, has tried topical testosterone in past with no improvement.    Denies abnormal vaginal bleeding, discharge, pelvic pain, problems with intercourse or other gynecologic concerns. Declines STI screen.   Gynecologic History Patient's last menstrual period was 05/17/2017 (approximate). Contraception: post menopausal status Last Pap: 2019. Results: normal Last mammogram: 2018. Results: Birads 1 DEXA: has never had  Obstetric History OB History  Gravida Para Term Preterm AB Living  2 1     1 1   SAB IAB Ectopic Multiple Live Births               # Outcome Date GA Lbr Len/2nd Weight Sex Delivery Anes PTL Lv  2 AB           1 Para             Past Medical History:  Diagnosis Date   Allergy    Depression    Diabetes mellitus without complication ([place])    Hyperlipidemia    Hypertension 1997   Impaired fasting glucose    Insomnia    Migraines    Preeclampsia    Vaginal Pap smear, abnormal     Past Surgical History:  Procedure Laterality Date   No prior surgery      Current Outpatient Medications on File Prior to Visit  Medication Sig Dispense Refill   atorvastatin (LIPITOR) 80 MG tablet Take 1 tablet (80 mg total) by mouth daily. 90 tablet 3   cholecalciferol (VITAMIN D3) 25 MCG (1000 UNIT) tablet Take 1,000 Units by mouth daily.     esomeprazole (NEXIUM) 20 MG capsule Take 20 mg by mouth daily at 12 noon.     metFORMIN (GLUCOPHAGE) 500 MG tablet TAKE 1 TABLET(500 MG) BY MOUTH TWICE DAILY WITH A MEAL 180 tablet 0   metoprolol tartrate (LOPRESSOR) 100 MG tablet TAKE 1 TABLET(100 MG) BY MOUTH TWICE DAILY 180 tablet 0   valsartan (DIOVAN) 320 MG tablet Take 1 tablet (320 mg total) by mouth daily. 90 tablet 1   verapamil (CALAN-SR) 120 MG CR tablet TAKE 1 TABLET(120 MG) BY MOUTH AT BEDTIME 90  tablet 1   No current facility-administered medications on file prior to visit.    Allergies  Allergen Reactions   Sumatriptan     Throat closing, neck swelling   Amlodipine Swelling   Codeine Sulfate     REACTION: makes nauseated   Hctz [Hydrochlorothiazide]     Caused low sodium/potassium levels   Lisinopril     Pancreatitis    Penicillins Other (See Comments)    Stomach Cramps   Spironolactone     Can't remember reaction   Zomig [Zolmitriptan] Other (See Comments)    Neck felt really tight.    Erythromycin Nausea Only   Garlic Nausea And Vomiting and Rash    Social History   Socioeconomic History   Marital status: Married    Spouse name: Not on file   Number of children: 1   Years of education: Not on file   Highest education level: Not on file  Occupational History   Not on file  Tobacco Use   Smoking status: Never   Smokeless tobacco: Never  Vaping Use   Vaping Use: Never used  Substance and Sexual Activity   Alcohol use: Yes    Comment: Occasional   Drug use:  No   Sexual activity: Yes    Comment: vasectomy  Other Topics Concern   Not on file  Social History Narrative   Not on file   Social Determinants of Health   Financial Resource Strain: Not on file  Food Insecurity: Not on file  Transportation Needs: Not on file  Physical Activity: Not on file  Stress: Not on file  Social Connections: Not on file  Intimate Partner Violence: Not on file    Family History  Problem Relation Age of Onset   Hypertension Mother    Diabetes Father    Cancer Paternal Grandmother 41       colon   Cancer Paternal Grandfather        myleoma     The following portions of the patient's history were reviewed and updated as appropriate: allergies, current medications, past family history, past medical history, past social history, past surgical history and problem list.  Review of Systems Pertinent items are noted in HPI.   Objective:  BP 133/86   Pulse 84    Resp 16   Ht 5\' 3"  (1.6 m)   Wt 148 lb (67.1 kg)   LMP 05/17/2017 (Approximate)   BMI 26.22 kg/m  CONSTITUTIONAL: Well-developed, well-nourished female in no acute distress.  HENT:  Normocephalic, atraumatic, External right and left ear normal. Oropharynx is clear and moist EYES: Conjunctivae and EOM are normal. Pupils are equal, round, and reactive to light. No scleral icterus.  NECK: Normal range of motion, supple, no masses.  Normal thyroid.  SKIN: Skin is warm and dry. No rash noted. Not diaphoretic. No erythema. No pallor. NEUROLOGIC: Alert and oriented to person, place, and time. Normal reflexes, muscle tone coordination. No cranial nerve deficit noted. PSYCHIATRIC: Normal mood and affect. Normal behavior. Normal judgment and thought content. CARDIOVASCULAR: Normal heart rate noted RESPIRATORY: Effort normal, no problems with respiration noted. BREASTS: Symmetric in size. No masses, skin changes, nipple drainage, or lymphadenopathy. ABDOMEN: Soft, no distention noted.  No tenderness, rebound or guarding.  PELVIC: Normal appearing external genitalia; normal appearing vaginal mucosa and cervix.  No abnormal discharge noted.  Pap smear obtained. Normal uterine size, no other palpable masses, no uterine or adnexal tenderness. MUSCULOSKELETAL: Normal range of motion. No tenderness.  No cyanosis, clubbing, or edema.   Exam done with chaperone present.   Assessment and Plan:   1. Screening mammogram, encounter for - MM Digital Screening; Future  2. Well woman exam Healthy female exam - Cytology - PAP( [place])  3. Cervical cancer screening    Will follow up results of pap smear and manage accordingly. Encouraged improvement in diet and exercise.  COVID vaccine  Declines STI screen. Mammogram ordered today Referral for colonoscopy n/a- has cologuard at home Flu vaccine declined DEXA not due based on age  Routine preventative health maintenance measures  emphasized. Please refer to After Visit Summary for other counseling recommendations.    Feliz Beam, MD, [place] [facility] [employer] [facility])

## 2022-05-06 ENCOUNTER — Other Ambulatory Visit: Payer: Self-pay

## 2022-05-06 DIAGNOSIS — I2510 Atherosclerotic heart disease of native coronary artery without angina pectoris: Secondary | ICD-10-CM

## 2022-05-06 DIAGNOSIS — E7800 Pure hypercholesterolemia, unspecified: Secondary | ICD-10-CM

## 2022-05-06 LAB — CYTOLOGY - PAP
Comment: NEGATIVE
Diagnosis: NEGATIVE
High risk HPV: NEGATIVE

## 2022-05-06 MED ORDER — ATORVASTATIN CALCIUM 80 MG PO TABS: 80.0000 mg | ORAL_TABLET | Freq: Every day | ORAL | 1 refills | Status: DC

## 2022-05-13 ENCOUNTER — Ambulatory Visit (INDEPENDENT_AMBULATORY_CARE_PROVIDER_SITE_OTHER): Payer: 59

## 2022-05-13 DIAGNOSIS — Z1231 Encounter for screening mammogram for malignant neoplasm of breast: Secondary | ICD-10-CM | POA: Diagnosis not present

## 2022-05-31 NOTE — Progress Notes (Signed)
HPI: FU CAD.  Abdominal CT June 2022 showed atherosclerosis of the coronary arteries.  Calcium score October 2022-696 which was 99th percentile.  Nuclear study October 2022 showed ejection fraction 71% with no ischemia or infarction.  Since last seen the patient has dyspnea with more extreme activities but not with routine activities. It is relieved with rest. It is not associated with chest pain. There is no orthopnea, PND or pedal edema. There is no syncope or palpitations. There is no exertional chest pain.   Current Outpatient Medications  Medication Sig Dispense Refill   atorvastatin (LIPITOR) 80 MG tablet Take 1 tablet (80 mg total) by mouth daily. Please keep schedule appointment for additional refills. 30 tablet 1   cholecalciferol (VITAMIN D3) 25 MCG (1000 UNIT) tablet Take 1,000 Units by mouth daily.     esomeprazole (NEXIUM) 20 MG capsule Take 20 mg by mouth daily at 12 noon.     metFORMIN (GLUCOPHAGE) 500 MG tablet TAKE 1 TABLET(500 MG) BY MOUTH TWICE DAILY WITH A MEAL 180 tablet 0   metoprolol tartrate (LOPRESSOR) 100 MG tablet TAKE 1 TABLET(100 MG) BY MOUTH TWICE DAILY 180 tablet 0   valsartan (DIOVAN) 320 MG tablet Take 1 tablet (320 mg total) by mouth daily. 90 tablet 1   verapamil (CALAN-SR) 120 MG CR tablet TAKE 1 TABLET(120 MG) BY MOUTH AT BEDTIME 90 tablet 1   No current facility-administered medications for this visit.     Past Medical History:  Diagnosis Date   Allergy    Depression    Diabetes mellitus without complication (HCC)    Hyperlipidemia    Hypertension 1997   Impaired fasting glucose    Insomnia    Migraines    Preeclampsia    Vaginal Pap smear, abnormal     Past Surgical History:  Procedure Laterality Date   No prior surgery      Social History   Socioeconomic History   Marital status: Married    Spouse name: Not on file   Number of children: 1   Years of education: Not on file   Highest education level: Not on file  Occupational  History   Not on file  Tobacco Use   Smoking status: Never   Smokeless tobacco: Never  Vaping Use   Vaping Use: Never used  Substance and Sexual Activity   Alcohol use: Yes    Comment: Occasional   Drug use: No   Sexual activity: Yes    Comment: vasectomy  Other Topics Concern   Not on file  Social History Narrative   Not on file   Social Determinants of Health   Financial Resource Strain: Not on file  Food Insecurity: Not on file  Transportation Needs: Not on file  Physical Activity: Not on file  Stress: Not on file  Social Connections: Not on file  Intimate Partner Violence: Not on file    Family History  Problem Relation Age of Onset   Hypertension Mother    Diabetes Father    Cancer Paternal Grandmother 60       colon   Cancer Paternal Grandfather        myleoma    ROS: no fevers or chills, productive cough, hemoptysis, dysphasia, odynophagia, melena, hematochezia, dysuria, hematuria, rash, seizure activity, orthopnea, PND, pedal edema, claudication. Remaining systems are negative.  Physical Exam: Well-developed well-nourished in no acute distress.  Skin is warm and dry.  HEENT is normal.  Neck is supple.  Chest is clear  to auscultation with normal expansion.  Cardiovascular exam is regular rate and rhythm.  Abdominal exam nontender or distended. No masses palpated. Extremities show no edema. neuro grossly intact  ECG-normal sinus rhythm at a rate of 71, left ventricular hypertrophy.  Personally reviewed  A/P  1 coronary calcification-patient denies chest pain.  Last nuclear study showed no ischemia or infarction.  Plan medical therapy.  Continue aspirin and statin.  2 hyperlipidemia-continue statin.    3 hypertension-patient's blood pressure is controlled.  Continue present medical regimen and follow.  Olga Millers, MD

## 2022-06-14 ENCOUNTER — Encounter: Payer: Self-pay | Admitting: Cardiology

## 2022-06-14 ENCOUNTER — Ambulatory Visit (INDEPENDENT_AMBULATORY_CARE_PROVIDER_SITE_OTHER): Payer: 59 | Admitting: Cardiology

## 2022-06-14 VITALS — BP 134/83 | HR 71 | Ht 63.0 in | Wt 151.0 lb

## 2022-06-14 DIAGNOSIS — I10 Essential (primary) hypertension: Secondary | ICD-10-CM | POA: Diagnosis not present

## 2022-06-14 DIAGNOSIS — I2510 Atherosclerotic heart disease of native coronary artery without angina pectoris: Secondary | ICD-10-CM | POA: Diagnosis not present

## 2022-06-14 DIAGNOSIS — E7800 Pure hypercholesterolemia, unspecified: Secondary | ICD-10-CM

## 2022-06-14 NOTE — Patient Instructions (Signed)
  Follow-Up: At [facility], you and your health needs are our priority.  As part of our continuing mission to provide you with exceptional heart care, we have created designated Provider Care Teams.  These Care Teams include your primary Cardiologist (physician) and Advanced Practice Providers (APPs -  Physician Assistants and Nurse Practitioners) who all work together to provide you with the care you need, when you need it.  We recommend signing up for the patient portal called "MyChart".  Sign up information is provided on this After Visit Summary.  MyChart is used to connect with patients for Virtual Visits (Telemedicine).  Patients are able to view lab/test results, encounter notes, upcoming appointments, etc.  Non-urgent messages can be sent to your provider as well.   To learn more about what you can do with MyChart, go to [email or web address].    Your next appointment:   12 month(s)  The format for your next appointment:   In Person  Provider:   Brian Crenshaw, MD   

## 2022-06-15 ENCOUNTER — Other Ambulatory Visit: Payer: Self-pay | Admitting: Family Medicine

## 2022-06-30 ENCOUNTER — Other Ambulatory Visit: Payer: Self-pay | Admitting: Cardiology

## 2022-06-30 DIAGNOSIS — I2510 Atherosclerotic heart disease of native coronary artery without angina pectoris: Secondary | ICD-10-CM

## 2022-06-30 DIAGNOSIS — E7800 Pure hypercholesterolemia, unspecified: Secondary | ICD-10-CM

## 2022-07-14 ENCOUNTER — Other Ambulatory Visit: Payer: Self-pay | Admitting: Family Medicine

## 2022-07-14 DIAGNOSIS — R7301 Impaired fasting glucose: Secondary | ICD-10-CM

## 2022-07-14 DIAGNOSIS — I10 Essential (primary) hypertension: Secondary | ICD-10-CM

## 2022-07-15 MED ORDER — METOPROLOL TARTRATE 100 MG PO TABS: 100.0000 mg | ORAL_TABLET | Freq: Two times a day (BID) | ORAL | 0 refills | Status: DC

## 2022-07-21 ENCOUNTER — Ambulatory Visit: Payer: 59 | Admitting: Family Medicine

## 2022-07-24 ENCOUNTER — Other Ambulatory Visit: Payer: Self-pay | Admitting: Family Medicine

## 2022-07-24 DIAGNOSIS — R7301 Impaired fasting glucose: Secondary | ICD-10-CM

## 2022-07-26 ENCOUNTER — Ambulatory Visit: Payer: 59 | Admitting: Family Medicine

## 2022-08-03 ENCOUNTER — Encounter: Payer: Self-pay | Admitting: Family Medicine

## 2022-08-03 DIAGNOSIS — I10 Essential (primary) hypertension: Secondary | ICD-10-CM

## 2022-08-04 MED ORDER — VALSARTAN 320 MG PO TABS: 320.0000 mg | ORAL_TABLET | Freq: Every day | ORAL | 0 refills | Status: DC

## 2022-08-12 ENCOUNTER — Ambulatory Visit (INDEPENDENT_AMBULATORY_CARE_PROVIDER_SITE_OTHER): Payer: 59 | Admitting: Family Medicine

## 2022-08-12 ENCOUNTER — Encounter: Payer: Self-pay | Admitting: Family Medicine

## 2022-08-12 VITALS — BP 125/63 | HR 73 | Ht 63.0 in | Wt 150.1 lb

## 2022-08-12 DIAGNOSIS — E119 Type 2 diabetes mellitus without complications: Secondary | ICD-10-CM

## 2022-08-12 DIAGNOSIS — R7301 Impaired fasting glucose: Secondary | ICD-10-CM

## 2022-08-12 DIAGNOSIS — Z23 Encounter for immunization: Secondary | ICD-10-CM | POA: Diagnosis not present

## 2022-08-12 DIAGNOSIS — I10 Essential (primary) hypertension: Secondary | ICD-10-CM | POA: Diagnosis not present

## 2022-08-12 LAB — POCT UA - MICROALBUMIN
Creatinine, POC: 50 mg/dL
Microalbumin Ur, POC: 10 mg/L

## 2022-08-12 MED ORDER — METOPROLOL TARTRATE 100 MG PO TABS: 100.0000 mg | ORAL_TABLET | Freq: Two times a day (BID) | ORAL | 3 refills | Status: DC

## 2022-08-12 NOTE — Assessment & Plan Note (Signed)
Will check A1c today.  Normally she is very well-controlled.  Continue metformin.  Any problems or concerns please let us know.

## 2022-08-12 NOTE — Assessment & Plan Note (Signed)
Well controlled. Continue current regimen. Follow up in  6 mo  

## 2022-08-12 NOTE — Progress Notes (Signed)
   Established Patient Office Visit  Subjective   Patient ID: Vanessa Valdez, female    DOB: January 30, 1966  Age: 57 y.o. MRN: [id number]  Chief Complaint  Patient presents with   Diabetes    HPI Hypertension- Pt denies chest pain, SOB, dizziness, or heart palpitations.  Taking meds as directed w/o problems.  Denies medication side effects.   Brough in home log.   Diabetes - no hypoglycemic events. No wounds or sores that are not healing well. No increased thirst or urination. Checking glucose at home. Taking medications as prescribed without any side effects.  Her dog of 20 years passed away.  She has been struggling with that.      ROS    Objective:     BP 125/63 (BP Location: Left Arm, Patient Position: Sitting, Cuff Size: Small)   Pulse 73   Ht 5\' 3"  (1.6 m)   Wt 150 lb 1.6 oz (68.1 kg)   LMP 05/17/2017 (Approximate)   SpO2 100%   BMI 26.59 kg/m    Physical Exam Vitals and nursing note reviewed.  Constitutional:      Appearance: She is well-developed.  HENT:     Head: Normocephalic and atraumatic.  Cardiovascular:     Rate and Rhythm: Normal rate and regular rhythm.     Heart sounds: Normal heart sounds.  Pulmonary:     Effort: Pulmonary effort is normal.     Breath sounds: Normal breath sounds.  Skin:    General: Skin is warm and dry.  Neurological:     Mental Status: She is alert and oriented to person, place, and time.  Psychiatric:        Behavior: Behavior normal.     Results for orders placed or performed in visit on 08/12/22  POCT UA - Microalbumin  Result Value Ref Range   Microalbumin Ur, POC 10 mg/L   Creatinine, POC 50 mg/dL   Albumin/Creatinine Ratio, Urine, POC 30-300       The ASCVD Risk score (Arnett DK, et al., 2019) failed to calculate for the following reasons:   The valid HDL cholesterol range is 20 to 100 mg/dL    Assessment & Plan:   Problem List Items Addressed This Visit       Cardiovascular and Mediastinum   HYPERTENSION,  BENIGN ESSENTIAL - Primary    Well controlled. Continue current regimen. Follow up in  6 mo       Relevant Medications   metoprolol tartrate (LOPRESSOR) 100 MG tablet   Other Relevant Orders   BASIC METABOLIC PANEL WITH GFR   HgB A1c     Endocrine   Type 2 diabetes mellitus without complication, without long-term current use of insulin (HCC)    Will check A1c today.  Normally she is very well-controlled.  Continue metformin.  Any problems or concerns please let us know.      Relevant Orders   BASIC METABOLIC PANEL WITH GFR   HgB A1c   POCT UA - Microalbumin (Completed)   Other Visit Diagnoses     Impaired fasting glucose       Relevant Medications   metoprolol tartrate (LOPRESSOR) 100 MG tablet   Need for Tdap vaccination       Relevant Orders   Tdap vaccine greater than or equal to 7yo IM (Completed)       Return in about 4 months (around 12/11/2022) for Diabetes follow-up, Hypertension.    Beatrice Lecher, MD

## 2022-08-13 LAB — BASIC METABOLIC PANEL WITH GFR
BUN: 17 mg/dL (ref 7–25)
CO2: 25 mmol/L (ref 20–32)
Calcium: 10.1 mg/dL (ref 8.6–10.4)
Chloride: 99 mmol/L (ref 98–110)
Creat: 0.78 mg/dL (ref 0.50–1.03)
Glucose, Bld: 93 mg/dL (ref 65–99)
Potassium: 5.2 mmol/L (ref 3.5–5.3)
Sodium: 134 mmol/L — ABNORMAL LOW (ref 135–146)
eGFR: 90 mL/min/{1.73_m2} (ref >=60)

## 2022-08-13 LAB — HEMOGLOBIN A1C
Hgb A1c MFr Bld: 5.9 % of total Hgb — ABNORMAL HIGH (ref <5.7)
Mean Plasma Glucose: 123 mg/dL
eAG (mmol/L): 6.8 mmol/L

## 2022-08-13 NOTE — Progress Notes (Signed)
Hi Vanessa Valdez, sodium was still just a little borderline low at 134 but much better than it was when we checked it 9 months ago when it was really off track.  Kidney function looks great.  A1c is 5.9, pretty stable from 9 months ago but up just slightly.

## 2022-08-31 ENCOUNTER — Other Ambulatory Visit: Payer: Self-pay | Admitting: Nurse Practitioner

## 2022-08-31 DIAGNOSIS — I10 Essential (primary) hypertension: Secondary | ICD-10-CM

## 2022-09-01 ENCOUNTER — Other Ambulatory Visit: Payer: Self-pay | Admitting: *Deleted

## 2022-09-01 DIAGNOSIS — I10 Essential (primary) hypertension: Secondary | ICD-10-CM

## 2022-09-01 MED ORDER — VALSARTAN 320 MG PO TABS: ORAL_TABLET | ORAL | 1 refills | Status: DC

## 2022-09-14 ENCOUNTER — Other Ambulatory Visit: Payer: Self-pay | Admitting: Family Medicine

## 2022-11-14 IMAGING — CT CT CARDIAC CORONARY ARTERY CALCIUM SCORE
3 series · 14 of 20 positions shown, 16 images · non-contrast
Comparison: None.
COMPARISON: None.

Addendum:
EXAM:
OVER-READ INTERPRETATION  CT CHEST

The following report is an over-read performed by radiologist Dr.
Yeshitela Diwan [REDACTED] on 04/20/2021. This
over-read does not include interpretation of cardiac or coronary
anatomy or pathology. The coronary calcium score interpretation by
the cardiologist is attached.
CLINICAL DATA: Cardiovascular Disease Risk stratification
Coronary Calcium Score
TECHNIQUE: A gated, non-contrast computed tomography scan of the heart was
performed using 3mm slice thickness. Axial images were analyzed on a
dedicated workstation. Calcium scoring of the coronary arteries was
performed using the Agatston method.

[Series 2: cascseq 2.0 sa36 70% (id) · axial · 0.39mm/px · z∈[-226,-136]mm · 4 of 76 slices shown]
[im 16/76  vessel]
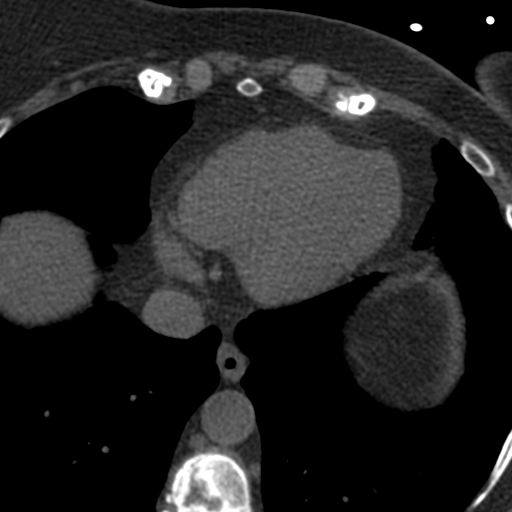
[im 31/76  vessel]
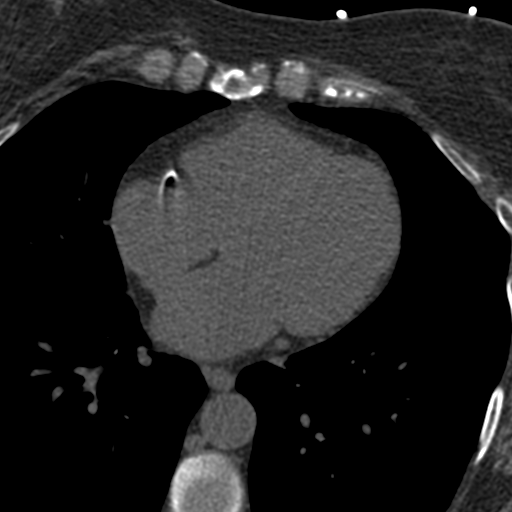
[im 46/76  vessel]
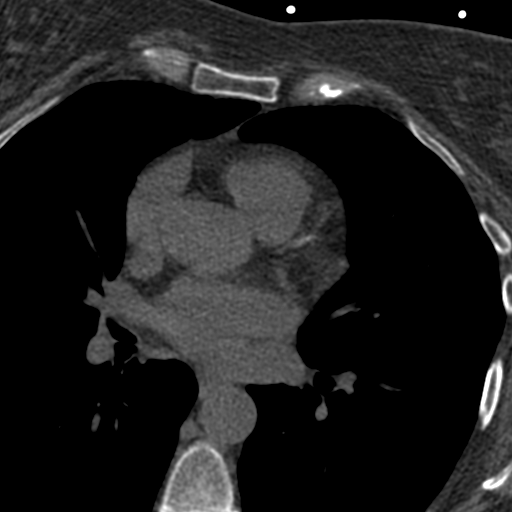
[im 61/76  vessel]
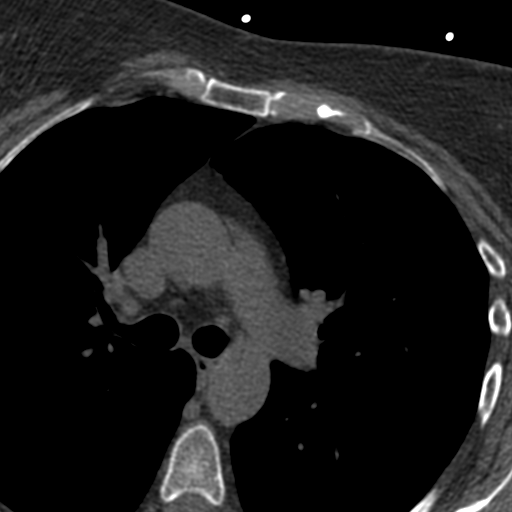

[Series 3: cascseq 2.0 bf37 st · axial · 0.65mm/px · z∈[-232,-132]mm · 5 of 76 slices shown, 7 images]
[im 13/76  vessel]
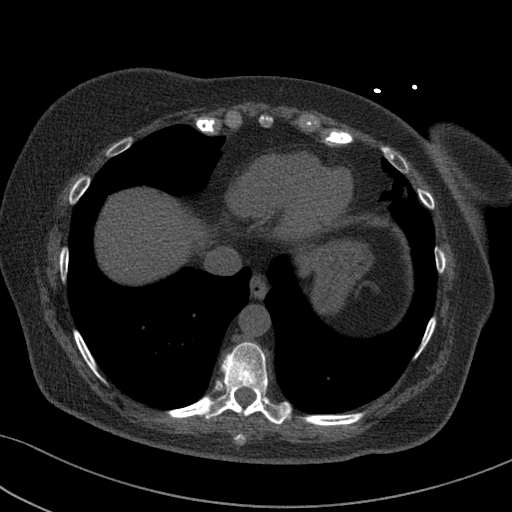
[im 13/76  lung]
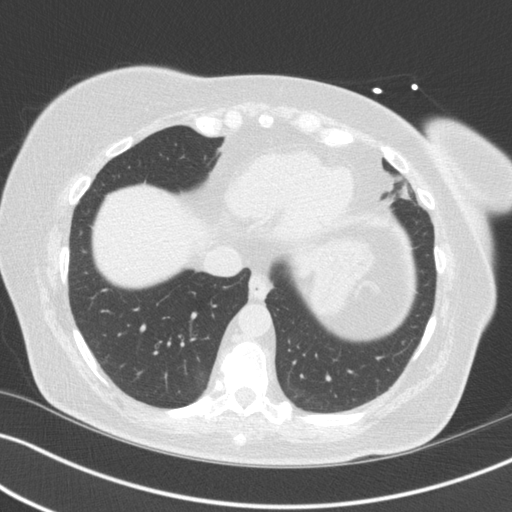
[im 26/76  vessel]
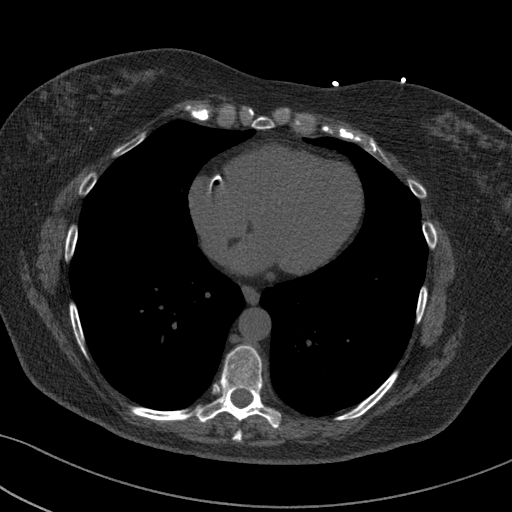
[im 38/76  vessel]
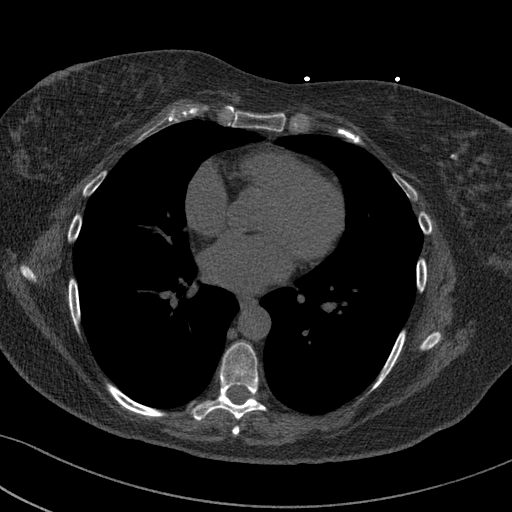
[im 51/76  vessel]
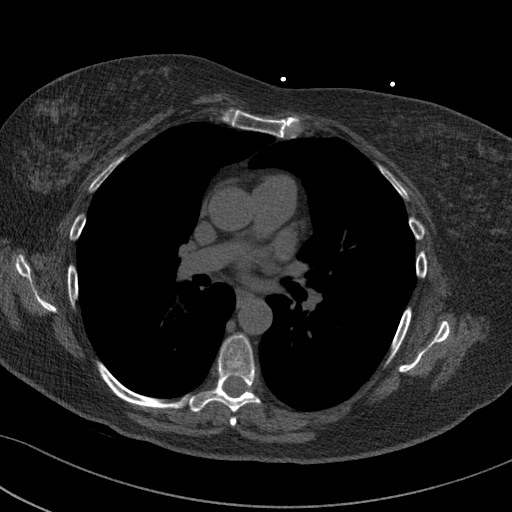
[im 63/76  vessel]
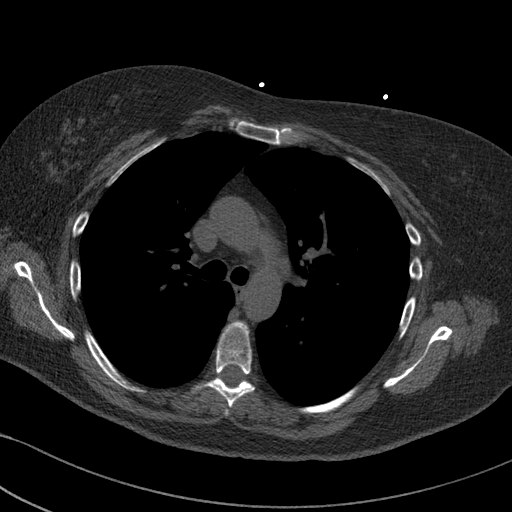
[im 63/76  lung]
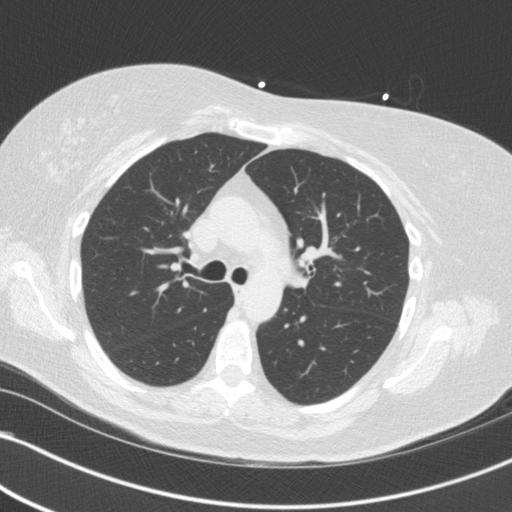

[Series 4: cascseq 2.0 br59 lung · axial · 0.65mm/px · z∈[-232,-132]mm · 5 of 76 slices shown]
[im 13/76  lung]
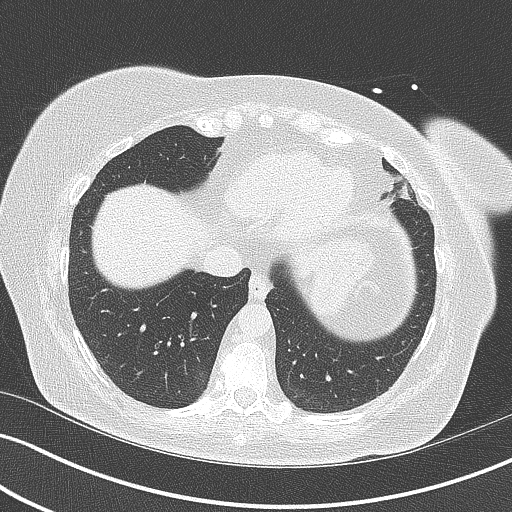
[im 26/76  lung]
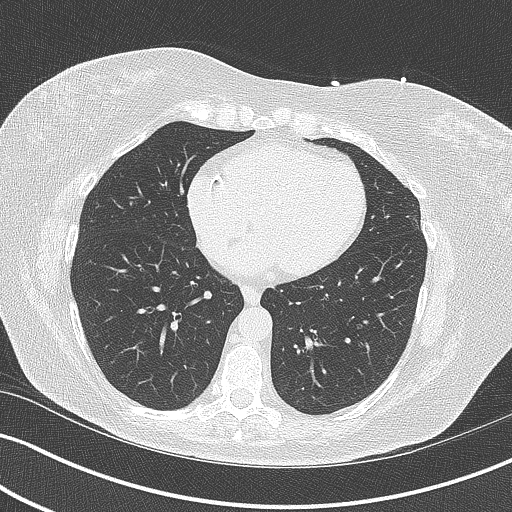
[im 38/76  lung]
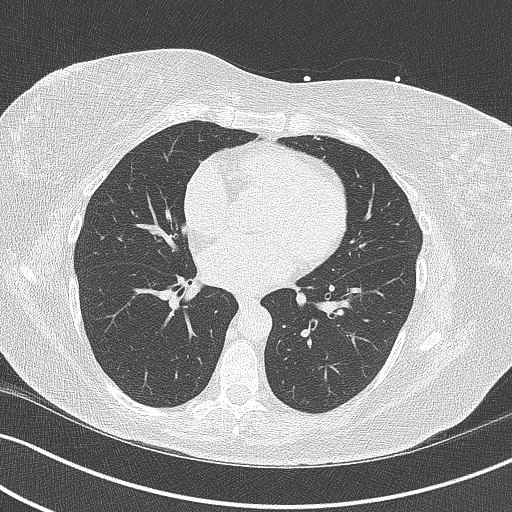
[im 51/76  lung]
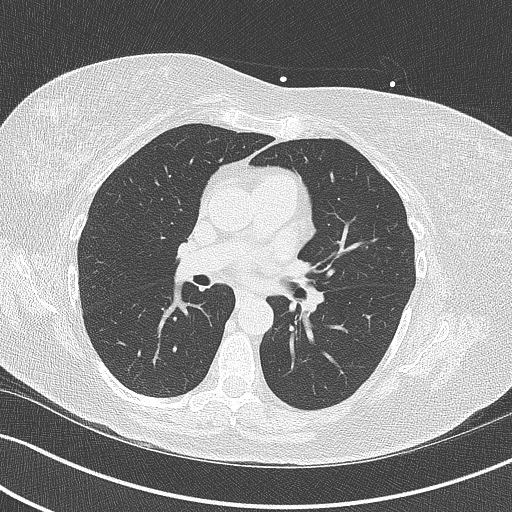
[im 63/76  lung]
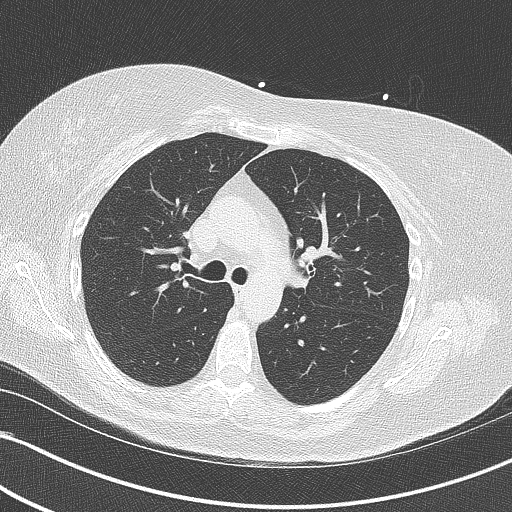

[14 of 20 positions shown; findings below may reference images not displayed]

FINDINGS: Atherosclerotic calcifications in the thoracic aorta. Within the
visualized portions of the thorax there are no suspicious appearing
pulmonary nodules or masses, there is no acute consolidative
airspace disease, no pleural effusions, no pneumothorax and no
lymphadenopathy. Visualized portions of the upper abdomen are
unremarkable. There are no aggressive appearing lytic or blastic
lesions noted in the visualized portions of the skeleton. Chronic
appearing compression fracture of a lower thoracic vertebral body
(likely T10) with 25% loss of anterior vertebral body height,
similar to prior CT the abdomen and pelvis 01/06/2021.
IMPRESSION: 1.  Aortic Atherosclerosis (G5YF1-ZQF.F).
FINDINGS: Coronary arteries: Normal origins.

Coronary Calcium Score:

Left main: 0

Left anterior descending artery: 221

Left circumflex artery: 0

Right coronary artery: 475

Total: 696

Percentile: 99th

Pericardium: Normal.

Ascending Aorta: Normal caliber.

Non-cardiac: See separate report from [REDACTED].
IMPRESSION: Coronary calcium score of 696 Agatston units. This was 99th
percentile for age-, race-, and sex-matched controls.



If CAC=0, it is reasonable to withhold statin therapy and reassess
in 5 to 10 years, as long as higher risk conditions are absent
(diabetes mellitus, family history of premature CHD in first degree
relatives (males <55 years; females <65 years), cigarette smoking,
or LDL >=190 mg/dL).

If CAC is 1 to 99, it is reasonable to initiate statin therapy for
patients >=55 years of age.

If CAC is >=100 or >=75th percentile, it is reasonable to initiate
statin therapy at any age.

Cardiology referral should be considered for patients with CAC
scores >=400 or >=75th percentile.

*9316 AHA/ACC/AACVPR/AAPA/ABC/WAMPUTSAR/AIFETE/KAKI/Rei/RAXRAXADZE/RTOYOTA/DO VALE
Guideline on the Management of Blood Cholesterol: A Report of the
American College of Cardiology/American Heart Association Task Force
on Clinical Practice Guidelines. J Am Coll Cardiol.
2205;73(24):3014-3986.

*** End of Addendum ***
EXAM:
OVER-READ INTERPRETATION  CT CHEST

The following report is an over-read performed by radiologist Dr.
Yeshitela Diwan [REDACTED] on 04/20/2021. This
over-read does not include interpretation of cardiac or coronary
anatomy or pathology. The coronary calcium score interpretation by
the cardiologist is attached.
FINDINGS: Atherosclerotic calcifications in the thoracic aorta. Within the
visualized portions of the thorax there are no suspicious appearing
pulmonary nodules or masses, there is no acute consolidative
airspace disease, no pleural effusions, no pneumothorax and no
lymphadenopathy. Visualized portions of the upper abdomen are
unremarkable. There are no aggressive appearing lytic or blastic
lesions noted in the visualized portions of the skeleton. Chronic
appearing compression fracture of a lower thoracic vertebral body
(likely T10) with 25% loss of anterior vertebral body height,
similar to prior CT the abdomen and pelvis 01/06/2021.
IMPRESSION: 1.  Aortic Atherosclerosis (G5YF1-ZQF.F).

## 2022-12-14 ENCOUNTER — Ambulatory Visit: Payer: 59 | Admitting: Family Medicine

## 2023-01-26 ENCOUNTER — Ambulatory Visit (INDEPENDENT_AMBULATORY_CARE_PROVIDER_SITE_OTHER): Payer: 59 | Admitting: Family Medicine

## 2023-01-26 ENCOUNTER — Encounter: Payer: Self-pay | Admitting: Family Medicine

## 2023-01-26 VITALS — BP 120/75 | HR 80 | Ht 63.0 in | Wt 147.0 lb

## 2023-01-26 DIAGNOSIS — I10 Essential (primary) hypertension: Secondary | ICD-10-CM | POA: Diagnosis not present

## 2023-01-26 DIAGNOSIS — R7301 Impaired fasting glucose: Secondary | ICD-10-CM

## 2023-01-26 DIAGNOSIS — I2510 Atherosclerotic heart disease of native coronary artery without angina pectoris: Secondary | ICD-10-CM

## 2023-01-26 DIAGNOSIS — K760 Fatty (change of) liver, not elsewhere classified: Secondary | ICD-10-CM

## 2023-01-26 LAB — POCT GLYCOSYLATED HEMOGLOBIN (HGB A1C): Hemoglobin A1C: 5.2 % (ref 4.0–5.6)

## 2023-01-26 NOTE — Assessment & Plan Note (Signed)
Well controlled. Continue current regimen. Follow up in  6 mo  

## 2023-01-26 NOTE — Progress Notes (Signed)
Established Patient Office Visit  Subjective   Patient ID: Vanessa Valdez, female    DOB: 05-May-1966  Age: 57 y.o. MRN: [id number]  Chief Complaint  Patient presents with   Diabetes    HPI  Impaired fasting glucose-no increased thirst or urination. No symptoms consistent with hypoglycemia.  He is eating more vegetables and has lost about 3 pounds.  Hypertension- Pt denies chest pain, SOB, dizziness, or heart palpitations.  Taking meds as directed w/o problems.  Denies medication side effects.    Reports that she has had a little bit more anxiety particularly at work it has been stressful as they have been short staffed and they are not really planning on hiring anybody in time soon.  She says sometimes it is just hard to let it go and that makes it more difficult to sleep.  But in her personal life she is actually doing really well and actually has a new grandson that is 7 months old now.     ROS    Objective:     BP 120/75   Pulse 80   Ht 5\' 3"  (1.6 m)   Wt 147 lb (66.7 kg)   LMP 05/17/2017 (Approximate)   SpO2 97%   BMI 26.04 kg/m    Physical Exam Vitals and nursing note reviewed.  Constitutional:      Appearance: She is well-developed.  HENT:     Head: Normocephalic and atraumatic.  Cardiovascular:     Rate and Rhythm: Normal rate and regular rhythm.     Heart sounds: Normal heart sounds.  Pulmonary:     Effort: Pulmonary effort is normal.     Breath sounds: Normal breath sounds.  Skin:    General: Skin is warm and dry.  Neurological:     Mental Status: She is alert and oriented to person, place, and time.  Psychiatric:        Behavior: Behavior normal.      Results for orders placed or performed in visit on 01/26/23  POCT glycosylated hemoglobin (Hb A1C)  Result Value Ref Range   Hemoglobin A1C 5.2 4.0 - 5.6 %   HbA1c POC (<> result, manual entry)     HbA1c, POC (prediabetic range)     HbA1c, POC (controlled diabetic range)        The ASCVD  Risk score (Arnett DK, et al., 2019) failed to calculate for the following reasons:   The valid HDL cholesterol range is 20 to 100 mg/dL    Assessment & Plan:   Problem List Items Addressed This Visit       Cardiovascular and Mediastinum   HYPERTENSION, BENIGN ESSENTIAL - Primary    Well controlled. Continue current regimen. Follow up in  6 mo       Relevant Orders   COMPLETE METABOLIC PANEL WITH GFR   Lipid Panel w/reflex Direct LDL   Coronary artery disease involving native heart without angina pectoris    Continue daily statin        Digestive   Fatty liver disease, nonalcoholic    Due to recheck liver enzymes.        Endocrine   Impaired fasting glucose    A1c is back down to 5.2 which is absolutely fantastic she is really made some great strides to change her diet and has been getting a lot more vegetables.  Plan to follow back up in 6 months.      Relevant Orders   POCT glycosylated hemoglobin (Hb  A1C) (Completed)   COMPLETE METABOLIC PANEL WITH GFR   Lipid Panel w/reflex Direct LDL     Other   Hypomagnesemia   Relevant Orders   Magnesium    Return in about 6 months (around 07/29/2023) for A1C and BP check .    Nani Gasser, MD

## 2023-01-26 NOTE — Assessment & Plan Note (Signed)
Continue daily statin. 

## 2023-01-26 NOTE — Assessment & Plan Note (Signed)
A1c is back down to 5.2 which is absolutely fantastic she is really made some great strides to change her diet and has been getting a lot more vegetables.  Plan to follow back up in 6 months.

## 2023-01-26 NOTE — Assessment & Plan Note (Signed)
Due to recheck liver enzymes. 

## 2023-02-10 ENCOUNTER — Other Ambulatory Visit: Payer: Self-pay | Admitting: *Deleted

## 2023-02-10 DIAGNOSIS — R7301 Impaired fasting glucose: Secondary | ICD-10-CM

## 2023-02-10 MED ORDER — METFORMIN HCL 500 MG PO TABS
500.0000 mg | ORAL_TABLET | Freq: Two times a day (BID) | ORAL | 1 refills | Status: DC
Start: 2023-02-10 — End: 2023-08-12

## 2023-03-07 ENCOUNTER — Other Ambulatory Visit: Payer: Self-pay | Admitting: Family Medicine

## 2023-03-07 DIAGNOSIS — I10 Essential (primary) hypertension: Secondary | ICD-10-CM

## 2023-03-08 ENCOUNTER — Other Ambulatory Visit: Payer: Self-pay | Admitting: *Deleted

## 2023-03-08 MED ORDER — VERAPAMIL HCL ER 120 MG PO TBCR: 120.0000 mg | EXTENDED_RELEASE_TABLET | Freq: Every day | ORAL | 1 refills | Status: DC

## 2023-04-05 ENCOUNTER — Other Ambulatory Visit: Payer: Self-pay | Admitting: *Deleted

## 2023-04-05 DIAGNOSIS — I10 Essential (primary) hypertension: Secondary | ICD-10-CM

## 2023-04-05 MED ORDER — VALSARTAN 320 MG PO TABS
ORAL_TABLET | ORAL | 1 refills | Status: DC
Start: 2023-04-05 — End: 2023-10-10

## 2023-04-22 ENCOUNTER — Other Ambulatory Visit: Payer: Self-pay | Admitting: Family Medicine

## 2023-04-22 DIAGNOSIS — Z1211 Encounter for screening for malignant neoplasm of colon: Secondary | ICD-10-CM

## 2023-04-22 DIAGNOSIS — Z1212 Encounter for screening for malignant neoplasm of rectum: Secondary | ICD-10-CM

## 2023-06-08 ENCOUNTER — Other Ambulatory Visit (HOSPITAL_BASED_OUTPATIENT_CLINIC_OR_DEPARTMENT_OTHER): Payer: Self-pay | Admitting: *Deleted

## 2023-06-08 ENCOUNTER — Other Ambulatory Visit (HOSPITAL_BASED_OUTPATIENT_CLINIC_OR_DEPARTMENT_OTHER): Payer: Self-pay | Admitting: Cardiology

## 2023-06-08 DIAGNOSIS — I2510 Atherosclerotic heart disease of native coronary artery without angina pectoris: Secondary | ICD-10-CM

## 2023-06-08 DIAGNOSIS — E7800 Pure hypercholesterolemia, unspecified: Secondary | ICD-10-CM

## 2023-06-08 MED ORDER — ATORVASTATIN CALCIUM 80 MG PO TABS
80.0000 mg | ORAL_TABLET | Freq: Every day | ORAL | 0 refills | Status: DC
Start: 2023-06-08 — End: 2023-06-09

## 2023-06-09 ENCOUNTER — Other Ambulatory Visit (HOSPITAL_BASED_OUTPATIENT_CLINIC_OR_DEPARTMENT_OTHER): Payer: Self-pay | Admitting: Cardiology

## 2023-06-09 DIAGNOSIS — I2510 Atherosclerotic heart disease of native coronary artery without angina pectoris: Secondary | ICD-10-CM

## 2023-06-09 DIAGNOSIS — E7800 Pure hypercholesterolemia, unspecified: Secondary | ICD-10-CM

## 2023-07-28 ENCOUNTER — Ambulatory Visit: Payer: 59 | Admitting: Family Medicine

## 2023-08-03 ENCOUNTER — Ambulatory Visit: Payer: 59 | Admitting: Family Medicine

## 2023-08-08 ENCOUNTER — Other Ambulatory Visit: Payer: Self-pay | Admitting: Family Medicine

## 2023-08-08 DIAGNOSIS — R7301 Impaired fasting glucose: Secondary | ICD-10-CM

## 2023-08-17 ENCOUNTER — Telehealth: Payer: Self-pay | Admitting: Family Medicine

## 2023-08-17 ENCOUNTER — Ambulatory Visit (INDEPENDENT_AMBULATORY_CARE_PROVIDER_SITE_OTHER): Payer: 59 | Admitting: Family Medicine

## 2023-08-17 ENCOUNTER — Encounter: Payer: Self-pay | Admitting: Family Medicine

## 2023-08-17 VITALS — BP 128/81 | HR 76 | Ht 63.0 in | Wt 158.0 lb

## 2023-08-17 DIAGNOSIS — I10 Essential (primary) hypertension: Secondary | ICD-10-CM

## 2023-08-17 DIAGNOSIS — R7301 Impaired fasting glucose: Secondary | ICD-10-CM | POA: Diagnosis not present

## 2023-08-17 DIAGNOSIS — I2510 Atherosclerotic heart disease of native coronary artery without angina pectoris: Secondary | ICD-10-CM | POA: Diagnosis not present

## 2023-08-17 DIAGNOSIS — D229 Melanocytic nevi, unspecified: Secondary | ICD-10-CM

## 2023-08-17 LAB — POCT GLYCOSYLATED HEMOGLOBIN (HGB A1C): Hemoglobin A1C: 5.8 % — AB (ref 4.0–5.6)

## 2023-08-17 MED ORDER — VERAPAMIL HCL ER 120 MG PO TBCR: 120.0000 mg | EXTENDED_RELEASE_TABLET | Freq: Every day | ORAL | 3 refills | Status: DC

## 2023-08-17 MED ORDER — METOPROLOL TARTRATE 100 MG PO TABS: 100.0000 mg | ORAL_TABLET | Freq: Two times a day (BID) | ORAL | 3 refills | Status: AC

## 2023-08-17 NOTE — Assessment & Plan Note (Signed)
A1c up just slightly at 5.8 from 5.2.  Continue to work on healthy diet and regular exercise she has not been working out as much as she was previously but plans to get back on track.

## 2023-08-17 NOTE — Assessment & Plan Note (Signed)
Well controlled. Continue current regimen. Follow up in  6 mo

## 2023-08-17 NOTE — Telephone Encounter (Signed)
Please call pt for clarification

## 2023-08-17 NOTE — Telephone Encounter (Unsigned)
Copied from CRM [id number]. Topic: Clinical - Prescription Issue >> Aug 17, 2023  3:49 PM Antony Haste wrote: Reason for CRM: Pharmacist from [facility] is requesting to a callback with Dr. Linford Arnold due to name discrepancy found for Metformin refill. She is wanting a verbal prescription phone-in for this patient. Callback #: [phone]

## 2023-08-17 NOTE — Addendum Note (Signed)
Addended by: Nani Gasser D on: 08/17/2023 01:37 PM   Modules accepted: Orders

## 2023-08-17 NOTE — Assessment & Plan Note (Signed)
Due to recheck lipid panel.  She is still taking atorvastatin.

## 2023-08-17 NOTE — Progress Notes (Addendum)
Established Patient Office Visit  Subjective  Patient ID: Vanessa Valdez, female    DOB: 1966-03-23  Age: 58 y.o. MRN: [id number]  Chief Complaint  Patient presents with   Hypertension   Diabetes    HPI  Hypertension- Pt denies chest pain, SOB, dizziness, or heart palpitations.  Taking meds as directed w/o problems.  Denies medication side effects.    Impaired fasting glucose-no increased thirst or urination. No symptoms consistent with hypoglycemia.  She has had some issues at the pharmacy her metformin was filled but under her husband's name and he does not take metformin.  And she never got the prescription for the metoprolol and is about to run out she only has 1 or 2 days left.  She also has a skin lesion on her right facial cheek she would like me to look at today she feels like it is getting more rough feeling and changing in texture.  ROS    Objective:     BP 128/81   Pulse 76   Ht 5\' 3"  (1.6 m)   Wt 158 lb (71.7 kg)   LMP 05/17/2017 (Approximate)   SpO2 99%   BMI 27.99 kg/m    Physical Exam Vitals and nursing note reviewed.  Constitutional:      Appearance: Normal appearance.  HENT:     Head: Normocephalic and atraumatic.  Eyes:     Conjunctiva/sclera: Conjunctivae normal.  Cardiovascular:     Rate and Rhythm: Normal rate and regular rhythm.  Pulmonary:     Effort: Pulmonary effort is normal.     Breath sounds: Normal breath sounds.  Skin:    General: Skin is warm and dry.  Neurological:     Mental Status: She is alert.  Psychiatric:        Mood and Affect: Mood normal.      Results for orders placed or performed in visit on 08/17/23  POCT HgB A1C  Result Value Ref Range   Hemoglobin A1C 5.8 (A) 4.0 - 5.6 %   HbA1c POC (<> result, manual entry)     HbA1c, POC (prediabetic range)     HbA1c, POC (controlled diabetic range)        The ASCVD Risk score (Arnett DK, et al., 2019) failed to calculate for the following reasons:   The valid HDL  cholesterol range is 20 to 100 mg/dL    Assessment & Plan:   Problem List Items Addressed This Visit       Cardiovascular and Mediastinum   HYPERTENSION, BENIGN ESSENTIAL - Primary   Well controlled. Continue current regimen. Follow up in  6 mo       Relevant Medications   metoprolol tartrate (LOPRESSOR) 100 MG tablet   verapamil (CALAN-SR) 120 MG CR tablet   Other Relevant Orders   CMP14+EGFR   Lipid panel   CBC   Urine Microalbumin w/creat. ratio   Coronary artery disease involving native heart without angina pectoris   Due to recheck lipid panel.  She is still taking atorvastatin.      Relevant Medications   metoprolol tartrate (LOPRESSOR) 100 MG tablet   verapamil (CALAN-SR) 120 MG CR tablet     Endocrine   Impaired fasting glucose   A1c up just slightly at 5.8 from 5.2.  Continue to work on healthy diet and regular exercise she has not been working out as much as she was previously but plans to get back on track.      Relevant Medications  metoprolol tartrate (LOPRESSOR) 100 MG tablet   Other Relevant Orders   CMP14+EGFR   Lipid panel   CBC   Urine Microalbumin w/creat. ratio   POCT HgB A1C (Completed)   Other Visit Diagnoses       Nevus       Relevant Orders   Ambulatory referral to Dermatology       Return in about 6 months (around 02/14/2024) for Hypertension, Pre-diabetes.    Nani Gasser, MD

## 2023-08-18 ENCOUNTER — Encounter: Payer: Self-pay | Admitting: Family Medicine

## 2023-08-18 LAB — CMP14+EGFR
ALT: 31 [IU]/L (ref 0–32)
AST: 36 [IU]/L (ref 0–40)
Albumin: 4.3 g/dL (ref 3.8–4.9)
Alkaline Phosphatase: 117 [IU]/L (ref 44–121)
BUN/Creatinine Ratio: 14 (ref 9–23)
BUN: 11 mg/dL (ref 6–24)
Bilirubin Total: 0.4 mg/dL (ref 0.0–1.2)
CO2: 21 mmol/L (ref 20–29)
Calcium: 9.6 mg/dL (ref 8.7–10.2)
Chloride: 100 mmol/L (ref 96–106)
Creatinine, Ser: 0.81 mg/dL (ref 0.57–1.00)
Globulin, Total: 2.3 g/dL (ref 1.5–4.5)
Glucose: 104 mg/dL — ABNORMAL HIGH (ref 70–99)
Potassium: 4.8 mmol/L (ref 3.5–5.2)
Sodium: 137 mmol/L (ref 134–144)
Total Protein: 6.6 g/dL (ref 6.0–8.5)
eGFR: 85 mL/min/{1.73_m2} (ref >59)

## 2023-08-18 LAB — LIPID PANEL
Chol/HDL Ratio: 1.6 {ratio} (ref 0.0–4.4)
Cholesterol, Total: 163 mg/dL (ref 100–199)
HDL: 105 mg/dL (ref >39)
LDL Chol Calc (NIH): 45 mg/dL (ref 0–99)
Triglycerides: 65 mg/dL (ref 0–149)
VLDL Cholesterol Cal: 13 mg/dL (ref 5–40)

## 2023-08-18 LAB — MICROALBUMIN / CREATININE URINE RATIO
Creatinine, Urine: 39.7 mg/dL
Microalb/Creat Ratio: <8 mg/g{creat} (ref 0–29)
Microalbumin, Urine: <3 ug/mL

## 2023-08-18 LAB — CBC
Hematocrit: 36.4 % (ref 34.0–46.6)
Hemoglobin: 11.7 g/dL (ref 11.1–15.9)
MCH: 28.6 pg (ref 26.6–33.0)
MCHC: 32.1 g/dL (ref 31.5–35.7)
MCV: 89 fL (ref 79–97)
Platelets: 261 10*3/uL (ref 150–450)
RBC: 4.09 x10E6/uL (ref 3.77–5.28)
RDW: 14.4 % (ref 11.7–15.4)
WBC: 6 10*3/uL (ref 3.4–10.8)

## 2023-08-18 NOTE — Progress Notes (Signed)
Your lab work is within acceptable range and there are no concerning findings.  Urine still pending.

## 2023-08-19 NOTE — Progress Notes (Signed)
Hi Vanessa Valdez, urine sample looks good no sign of excess protein spillage which is great news.  Let us know where you had your last eye exam so we can get an up-to-date copy scanned into your chart.

## 2023-08-25 NOTE — Telephone Encounter (Signed)
 Patient states she has the metformin .

## 2023-09-14 ENCOUNTER — Other Ambulatory Visit: Payer: Self-pay | Admitting: Family Medicine

## 2023-09-14 DIAGNOSIS — R7301 Impaired fasting glucose: Secondary | ICD-10-CM

## 2023-09-19 ENCOUNTER — Telehealth: Payer: Self-pay | Admitting: Family Medicine

## 2023-09-19 NOTE — Telephone Encounter (Signed)
 Copied from CRM [id number]. Topic: Clinical - Prescription Issue >> Sep 19, 2023  9:51 AM Shon Hale wrote: Reason for CRM: Mimi w/ [facility] requesting clarification on metformin RX. They received rx for patient's husband who is also a patient & he stated he has never been on it but patient has. [facility] attempted to request refill and it was denied, needing to know if metformin is for patient or for husband.   Requesting call back w/ clarification, [phone]

## 2023-09-22 ENCOUNTER — Telehealth: Payer: Self-pay

## 2023-09-22 ENCOUNTER — Telehealth: Payer: Self-pay | Admitting: Family Medicine

## 2023-09-22 NOTE — Telephone Encounter (Signed)
 Copied from CRM [id number]. Topic: Clinical - Prescription Issue >> Sep 19, 2023  9:51 AM Shon Hale wrote: Reason for CRM: Mimi w/ [facility] requesting clarification on metformin RX. They received rx for patient's husband who is also a patient & he stated he has never been on it but patient has. [facility] attempted to request refill and it was denied, needing to know if metformin is for patient or for husband.   Requesting call back w/ clarification, [phone]

## 2023-09-22 NOTE — Telephone Encounter (Signed)
 Spoke with pharmacist States they received a verbal order for Vanessa Valdez 08/18/1954 for Metformin  States she can not read who phoned it in but states prescriber shows Dr. Linford Arnold.  Informed her that he is not our patient and we do not show that anything was sent or phoned in for him from our office.  She will d/c this order and mark as an error.  Verified that patient Vanessa Valdez should have metformin prescription that was sent on 08/12/2023 for Qty #180 with one refill.

## 2023-09-22 NOTE — Telephone Encounter (Signed)
 E2C2 agent following up on clarification of med.

## 2023-09-27 NOTE — Telephone Encounter (Signed)
 This has been resolved

## 2023-09-29 NOTE — Telephone Encounter (Signed)
 This was addressed by Laurel Dimmer.

## 2023-10-10 ENCOUNTER — Other Ambulatory Visit: Payer: Self-pay | Admitting: *Deleted

## 2023-10-10 DIAGNOSIS — I10 Essential (primary) hypertension: Secondary | ICD-10-CM

## 2023-10-10 MED ORDER — VALSARTAN 320 MG PO TABS: ORAL_TABLET | ORAL | 1 refills | Status: DC

## 2024-02-13 ENCOUNTER — Ambulatory Visit: Payer: 59 | Admitting: Family Medicine

## 2024-04-03 ENCOUNTER — Ambulatory Visit: Admitting: Family Medicine

## 2024-04-06 ENCOUNTER — Other Ambulatory Visit: Payer: Self-pay

## 2024-04-06 DIAGNOSIS — R7301 Impaired fasting glucose: Secondary | ICD-10-CM

## 2024-04-06 MED ORDER — METFORMIN HCL 500 MG PO TABS: 500.0000 mg | ORAL_TABLET | Freq: Two times a day (BID) | ORAL | 0 refills | Status: DC

## 2024-04-10 ENCOUNTER — Other Ambulatory Visit: Payer: Self-pay | Admitting: Family Medicine

## 2024-04-10 DIAGNOSIS — I10 Essential (primary) hypertension: Secondary | ICD-10-CM

## 2024-04-23 ENCOUNTER — Ambulatory Visit: Admitting: Family Medicine

## 2024-04-23 DIAGNOSIS — I10 Essential (primary) hypertension: Secondary | ICD-10-CM

## 2024-04-23 DIAGNOSIS — Z1211 Encounter for screening for malignant neoplasm of colon: Secondary | ICD-10-CM

## 2024-04-23 DIAGNOSIS — R7301 Impaired fasting glucose: Secondary | ICD-10-CM

## 2024-05-02 ENCOUNTER — Telehealth: Payer: Self-pay

## 2024-05-02 NOTE — Telephone Encounter (Signed)
 Left message for a return call for colon cancer screening. No record of colonoscopy or Cologuard.

## 2024-05-04 ENCOUNTER — Other Ambulatory Visit: Payer: Self-pay | Admitting: *Deleted

## 2024-05-04 MED ORDER — VERAPAMIL HCL ER 120 MG PO TBCR: 120.0000 mg | EXTENDED_RELEASE_TABLET | Freq: Every day | ORAL | 1 refills | Status: DC

## 2024-05-08 ENCOUNTER — Telehealth: Payer: Self-pay

## 2024-05-08 NOTE — Telephone Encounter (Signed)
 This note is blank, did she need something?

## 2024-05-09 MED ORDER — VERAPAMIL HCL ER 120 MG PO CP24: 120.0000 mg | ORAL_CAPSULE | Freq: Every day | ORAL | 1 refills | Status: AC

## 2024-05-09 NOTE — Telephone Encounter (Signed)
 Pended new prescription for verapamil  120mg  capsule only showing as 24 hour  Please review that medication is correct before sending.

## 2024-05-09 NOTE — Telephone Encounter (Signed)
Ok to change to capsule 

## 2024-05-15 ENCOUNTER — Telehealth: Payer: Self-pay

## 2024-05-15 NOTE — Telephone Encounter (Signed)
 Copied from CRM [id number]. Topic: Clinical - Prescription Issue >> May 15, 2024  8:15 AM Aleatha BROCKS wrote: Reason for CRM: [id number] - [place], [place] - [address] AT [facility] [address] [place] [address] Phone: [phone] Fax: [phone] Hours: Not open 24 hours  Informing that  Verapamil  (VERELAN ) 120 MG 24 hr capsule is on back order and unable to get and tablets is also on back order, only the 180 is available or to change to a  different medication

## 2024-05-16 NOTE — Telephone Encounter (Signed)
 Call patient and let her know information below to see if she would be willing to switch to amlodipine ?  Or we can do the verapamil  but do a higher strength

## 2024-05-17 NOTE — Telephone Encounter (Signed)
 Attempted call to patient. Left a voice mail message requesting a return call . Gave my direct phone # and office main # for return call.

## 2024-05-18 NOTE — Telephone Encounter (Signed)
 Spoke with patient. The pharmacy called the patient and they had received the Verapamil  120mg  but as capsules - so she has already picked up the medication  at her current strength .

## 2024-06-02 ENCOUNTER — Other Ambulatory Visit: Payer: Self-pay | Admitting: Family Medicine

## 2024-06-02 DIAGNOSIS — R7301 Impaired fasting glucose: Secondary | ICD-10-CM

## 2024-06-09 ENCOUNTER — Other Ambulatory Visit (HOSPITAL_BASED_OUTPATIENT_CLINIC_OR_DEPARTMENT_OTHER): Payer: Self-pay | Admitting: Cardiology

## 2024-06-09 DIAGNOSIS — I2510 Atherosclerotic heart disease of native coronary artery without angina pectoris: Secondary | ICD-10-CM

## 2024-06-09 DIAGNOSIS — E7800 Pure hypercholesterolemia, unspecified: Secondary | ICD-10-CM

## 2024-06-12 ENCOUNTER — Ambulatory Visit: Admitting: Family Medicine

## 2024-06-12 ENCOUNTER — Encounter: Payer: Self-pay | Admitting: Family Medicine

## 2024-06-12 VITALS — BP 124/74 | HR 75 | Ht 63.0 in | Wt 165.1 lb

## 2024-06-12 DIAGNOSIS — E785 Hyperlipidemia, unspecified: Secondary | ICD-10-CM | POA: Insufficient documentation

## 2024-06-12 DIAGNOSIS — Z6829 Body mass index (BMI) 29.0-29.9, adult: Secondary | ICD-10-CM

## 2024-06-12 DIAGNOSIS — I10 Essential (primary) hypertension: Secondary | ICD-10-CM

## 2024-06-12 DIAGNOSIS — I2510 Atherosclerotic heart disease of native coronary artery without angina pectoris: Secondary | ICD-10-CM

## 2024-06-12 DIAGNOSIS — Z23 Encounter for immunization: Secondary | ICD-10-CM | POA: Diagnosis not present

## 2024-06-12 DIAGNOSIS — Z1231 Encounter for screening mammogram for malignant neoplasm of breast: Secondary | ICD-10-CM

## 2024-06-12 DIAGNOSIS — R7301 Impaired fasting glucose: Secondary | ICD-10-CM

## 2024-06-12 DIAGNOSIS — Z1211 Encounter for screening for malignant neoplasm of colon: Secondary | ICD-10-CM

## 2024-06-12 LAB — POCT GLYCOSYLATED HEMOGLOBIN (HGB A1C): Hemoglobin A1C: 5.4 % (ref 4.0–5.6)

## 2024-06-12 MED ORDER — METFORMIN HCL 500 MG PO TABS
500.0000 mg | ORAL_TABLET | Freq: Two times a day (BID) | ORAL | 1 refills | Status: AC
Start: 2024-06-12 — End: ?

## 2024-06-12 NOTE — Progress Notes (Signed)
 Established Patient Office Visit  Patient ID: Vanessa Valdez, female    DOB: 25-Mar-1966  Age: 58 y.o. MRN: [id number] PCP: Alvan Dorothyann BIRCH, MD  Chief Complaint  Patient presents with   ifg   Hypertension    Subjective:     HPI  Discussed the use of AI scribe software for clinical note transcription with the patient, who gave verbal consent to proceed.  History of Present Illness Vanessa Valdez is a 58 year old female with prediabetes who presents for follow-up and management.  Glycemic control - Prediabetes with most recent hemoglobin A1c of 5.4, improved from previous value of 5.9 - Continues metformin  therapy without adverse effects  Acute sinus infection - Sinus infection occurred last week, managed via virtual visit - Associated with severe headaches - Treated with Augmentin, which caused gastrointestinal discomfort - Probiotic used to alleviate gastrointestinal symptoms - Headaches have resolved  Pharmacy and medication synchronization issues - Difficulties with [facility] pharmacy regarding medication refills - Medication synchronization has only occurred twice this year  Blood pressure symptoms - No elevated blood pressure readings - No unusual headaches or swelling     ROS    Objective:     BP 124/74   Pulse 75   Ht 5' 3 (1.6 m)   Wt 165 lb 1.9 oz (74.9 kg)   LMP 05/17/2017 (Approximate)   SpO2 100%   BMI 29.25 kg/m    Physical Exam Vitals and nursing note reviewed.  Constitutional:      Appearance: Normal appearance.  HENT:     Head: Normocephalic and atraumatic.  Eyes:     Conjunctiva/sclera: Conjunctivae normal.  Cardiovascular:     Rate and Rhythm: Normal rate and regular rhythm.  Pulmonary:     Effort: Pulmonary effort is normal.     Breath sounds: Normal breath sounds.  Skin:    General: Skin is warm and dry.  Neurological:     Mental Status: She is alert.  Psychiatric:        Mood and Affect: Mood normal.      Results  for orders placed or performed in visit on 06/12/24  POCT HgB A1C  Result Value Ref Range   Hemoglobin A1C 5.4 4.0 - 5.6 %   HbA1c POC (<> result, manual entry)     HbA1c, POC (prediabetic range)     HbA1c, POC (controlled diabetic range)        The ASCVD Risk score (Arnett DK, et al., 2019) failed to calculate for the following reasons:   The valid HDL cholesterol range is 20 to 100 mg/dL    Assessment & Plan:   Problem List Items Addressed This Visit       Cardiovascular and Mediastinum   HYPERTENSION, BENIGN ESSENTIAL - Primary    Essential Hypertension Blood pressure well-controlled. Continue verapamil , valsartan , and metoprolol .      Relevant Orders   CMP14+EGFR   Coronary artery disease involving native heart without angina pectoris   Continue daily statin.  Follows with cardiology.        Endocrine   Impaired fasting glucose   Impaired Fasting Glucose A1c at 5.4 indicates excellent glycemic control. - Continue metformin  as prescribed.      Relevant Medications   metFORMIN  (GLUCOPHAGE ) 500 MG tablet   Other Relevant Orders   POCT HgB A1C (Completed)   CMP14+EGFR     Other   Hyperlipidemia   Continue daily statin.  Lab Results  Component Value Date  CHOL 163 08/17/2023   HDL 105 08/17/2023   LDLCALC 45 08/17/2023   TRIG 65 08/17/2023   CHOLHDL 1.6 08/17/2023         BMI 29.0-29.9,adult   Continue to work on healthy diet and staying active.       Other Visit Diagnoses       Screening mammogram for breast cancer       Relevant Orders   MM 3D SCREENING MAMMOGRAM BILATERAL BREAST     Screen for colon cancer       Relevant Orders   Cologuard     Encounter for immunization       Relevant Orders   Pneumococcal conjugate vaccine 20-valent (Completed)       Assessment and Plan Assessment & Plan   General Health Maintenance Pneumonia vaccine and colon cancer screening recommended per guidelines. - Administered Prevnar 20 vaccine. -  Ordered Cologuard for colon cancer screening. - order mammo for BrCa screening.     Return in about 6 months (around 12/10/2024) for Hypertension, Pre-diabetes.    Dorothyann Byars, MD [facility] at [facility]

## 2024-06-12 NOTE — Assessment & Plan Note (Signed)
 Continue to work on [employer] and staying active.

## 2024-06-12 NOTE — Assessment & Plan Note (Addendum)
  Essential Hypertension Blood pressure well-controlled. Continue verapamil , valsartan , and metoprolol .

## 2024-06-12 NOTE — Assessment & Plan Note (Signed)
 Continue daily statin.  Follows with cardiology.

## 2024-06-12 NOTE — Assessment & Plan Note (Signed)
 Continue daily statin.  Lab Results  Component Value Date   CHOL 163 08/17/2023   HDL 105 08/17/2023   LDLCALC 45 08/17/2023   TRIG 65 08/17/2023   CHOLHDL 1.6 08/17/2023

## 2024-06-12 NOTE — Assessment & Plan Note (Signed)
 Impaired Fasting Glucose A1c at 5.4 indicates excellent glycemic control. - Continue metformin  as prescribed.

## 2024-06-13 ENCOUNTER — Ambulatory Visit: Payer: Self-pay | Admitting: Family Medicine

## 2024-06-13 ENCOUNTER — Other Ambulatory Visit: Payer: Self-pay | Admitting: Family Medicine

## 2024-06-13 DIAGNOSIS — R748 Abnormal levels of other serum enzymes: Secondary | ICD-10-CM

## 2024-06-13 LAB — CMP14+EGFR
ALT: 46 IU/L — ABNORMAL HIGH (ref 0–32)
AST: 48 IU/L — ABNORMAL HIGH (ref 0–40)
Albumin: 4.3 g/dL (ref 3.8–4.9)
Alkaline Phosphatase: 104 IU/L (ref 49–135)
BUN/Creatinine Ratio: 13 (ref 9–23)
BUN: 10 mg/dL (ref 6–24)
Bilirubin Total: 0.8 mg/dL (ref 0.0–1.2)
CO2: 22 mmol/L (ref 20–29)
Calcium: 10.2 mg/dL (ref 8.7–10.2)
Chloride: 95 mmol/L — ABNORMAL LOW (ref 96–106)
Creatinine, Ser: 0.8 mg/dL (ref 0.57–1.00)
Globulin, Total: 2.4 g/dL (ref 1.5–4.5)
Glucose: 107 mg/dL — ABNORMAL HIGH (ref 70–99)
Potassium: 4.9 mmol/L (ref 3.5–5.2)
Sodium: 133 mmol/L — ABNORMAL LOW (ref 134–144)
Total Protein: 6.7 g/dL (ref 6.0–8.5)
eGFR: 86 mL/min/1.73 (ref >59)

## 2024-06-13 NOTE — Progress Notes (Signed)
 Hi Chyla, sodium still borderline low but it is pretty stable.  Your liver enzymes were elevated this time though which is a little unusual.  Just recommend avoid any alcohol or Tylenol  products for the next few weeks and lets plan to recheck liver function in about 3 weeks.  Lab order already placed.

## 2024-07-16 ENCOUNTER — Other Ambulatory Visit (HOSPITAL_BASED_OUTPATIENT_CLINIC_OR_DEPARTMENT_OTHER): Payer: Self-pay | Admitting: Cardiology

## 2024-07-16 DIAGNOSIS — I2510 Atherosclerotic heart disease of native coronary artery without angina pectoris: Secondary | ICD-10-CM

## 2024-07-16 DIAGNOSIS — E7800 Pure hypercholesterolemia, unspecified: Secondary | ICD-10-CM

## 2024-07-27 ENCOUNTER — Telehealth: Payer: Self-pay | Admitting: Family Medicine

## 2024-07-27 NOTE — Telephone Encounter (Signed)
 Pls call pt and encourage her to schdule her mammo. Order was placed in in the fall

## 2024-07-30 NOTE — Telephone Encounter (Signed)
 Attempted to contact patient but unfortunately was sent to voicemail. I have left a voice message requesting a call back to our office.

## 2024-07-30 NOTE — Telephone Encounter (Signed)
 Left message for a return call

## 2024-08-08 ENCOUNTER — Other Ambulatory Visit (HOSPITAL_BASED_OUTPATIENT_CLINIC_OR_DEPARTMENT_OTHER): Payer: Self-pay | Admitting: Cardiology

## 2024-08-08 DIAGNOSIS — I2510 Atherosclerotic heart disease of native coronary artery without angina pectoris: Secondary | ICD-10-CM

## 2024-08-08 DIAGNOSIS — E7800 Pure hypercholesterolemia, unspecified: Secondary | ICD-10-CM

## 2024-12-11 ENCOUNTER — Ambulatory Visit: Admitting: Family Medicine
